# Patient Record
Sex: Male | Born: 1975
Health system: Southern US, Community
[De-identification: ages and names within clinical notes are randomized; demographics above are authoritative.]

## PROBLEM LIST (undated history)

## (undated) DIAGNOSIS — S52502A Unspecified fracture of the lower end of left radius, initial encounter for closed fracture: Secondary | ICD-10-CM

## (undated) HISTORY — PX: TONSILLECTOMY AND ADENOIDECTOMY: SHX28

---

## 2007-02-21 ENCOUNTER — Emergency Department (HOSPITAL_COMMUNITY): Admission: EM | Admit: 2007-02-21 | Discharge: 2007-02-21 | Payer: Self-pay | Admitting: Emergency Medicine

## 2015-05-23 ENCOUNTER — Encounter (HOSPITAL_COMMUNITY): Payer: Self-pay | Admitting: Emergency Medicine

## 2015-05-23 ENCOUNTER — Emergency Department (HOSPITAL_COMMUNITY)
Admission: EM | Admit: 2015-05-23 | Discharge: 2015-05-23 | Disposition: A | Discharge status: Home / Self Care | Payer: 59 | Attending: Emergency Medicine | Admitting: Emergency Medicine

## 2015-05-23 DIAGNOSIS — J029 Acute pharyngitis, unspecified: Secondary | ICD-10-CM | POA: Diagnosis not present

## 2015-05-23 DIAGNOSIS — R05 Cough: Secondary | ICD-10-CM | POA: Diagnosis not present

## 2015-05-23 DIAGNOSIS — Z79899 Other long term (current) drug therapy: Secondary | ICD-10-CM | POA: Diagnosis not present

## 2015-05-23 DIAGNOSIS — J019 Acute sinusitis, unspecified: Secondary | ICD-10-CM | POA: Diagnosis not present

## 2015-05-23 DIAGNOSIS — Z720 Tobacco use: Secondary | ICD-10-CM | POA: Insufficient documentation

## 2015-05-23 DIAGNOSIS — R0981 Nasal congestion: Secondary | ICD-10-CM | POA: Diagnosis present

## 2015-05-23 DIAGNOSIS — R059 Cough, unspecified: Secondary | ICD-10-CM

## 2015-05-23 MED ORDER — SALINE SPRAY 0.65 % NA SOLN: 1.0000 | NASAL | Status: DC | PRN

## 2015-05-23 MED ORDER — FLUTICASONE PROPIONATE 50 MCG/ACT NA SUSP: 2.0000 | Freq: Every day | NASAL | Status: DC

## 2015-05-23 MED ORDER — BENZONATATE 100 MG PO CAPS: 100.0000 mg | ORAL_CAPSULE | Freq: Three times a day (TID) | ORAL | Status: DC

## 2015-05-23 MED ORDER — PREDNISONE 20 MG PO TABS: 40.0000 mg | ORAL_TABLET | Freq: Every day | ORAL | Status: DC

## 2015-05-23 MED ORDER — ALBUTEROL SULFATE HFA 108 (90 BASE) MCG/ACT IN AERS
2.0000 | INHALATION_SPRAY | Freq: Once | RESPIRATORY_TRACT | Status: AC
  Administered 2015-05-23: 2 via RESPIRATORY_TRACT
  Filled 2015-05-23: qty 6.7

## 2015-05-23 NOTE — Discharge Instructions (Signed)
Take prednisone as prescribed and using albuterol inhaler, 2 puffs every 4-6 hours, as needed for cough and shortness of breath. Take Tessalon as needed for cough. Use saline nasal spray for congestion. You may also use Flonase as prescribed if desired for congestion. Continue with over-the-counter regimens as needed. Follow-up with a primary care provider for a recheck of symptoms.  Sinusitis Sinusitis is redness, soreness, and inflammation of the paranasal sinuses. Paranasal sinuses are air pockets within the bones of your face (beneath the eyes, the middle of the forehead, or above the eyes). In healthy paranasal sinuses, mucus is able to drain out, and air is able to circulate through them by way of your nose. However, when your paranasal sinuses are inflamed, mucus and air can become trapped. This can allow bacteria and other germs to grow and cause infection. Sinusitis can develop quickly and last only a short time (acute) or continue over a long period (chronic). Sinusitis that lasts for more than 12 weeks is considered chronic.  CAUSES  Causes of sinusitis include:  Allergies.  Structural abnormalities, such as displacement of the cartilage that separates your nostrils (deviated septum), which can decrease the air flow through your nose and sinuses and affect sinus drainage.  Functional abnormalities, such as when the small hairs (cilia) that line your sinuses and help remove mucus do not work properly or are not present. SIGNS AND SYMPTOMS  Symptoms of acute and chronic sinusitis are the same. The primary symptoms are pain and pressure around the affected sinuses. Other symptoms include:  Upper toothache.  Earache.  Headache.  Bad breath.  Decreased sense of smell and taste.  A cough, which worsens when you are lying flat.  Fatigue.  Fever.  Thick drainage from your nose, which often is green and may contain pus (purulent).  Swelling and warmth over the affected  sinuses. DIAGNOSIS  Your health care provider will perform a physical exam. During the exam, your health care provider may:  Look in your nose for signs of abnormal growths in your nostrils (nasal polyps).  Tap over the affected sinus to check for signs of infection.  View the inside of your sinuses (endoscopy) using an imaging device that has a light attached (endoscope). If your health care provider suspects that you have chronic sinusitis, one or more of the following tests may be recommended:  Allergy tests.  Nasal culture. A sample of mucus is taken from your nose, sent to a lab, and screened for bacteria.  Nasal cytology. A sample of mucus is taken from your nose and examined by your health care provider to determine if your sinusitis is related to an allergy. TREATMENT  Most cases of acute sinusitis are related to a viral infection and will resolve on their own within 10 days. Sometimes medicines are prescribed to help relieve symptoms (pain medicine, decongestants, nasal steroid sprays, or saline sprays).  However, for sinusitis related to a bacterial infection, your health care provider will prescribe antibiotic medicines. These are medicines that will help kill the bacteria causing the infection.  Rarely, sinusitis is caused by a fungal infection. In theses cases, your health care provider will prescribe antifungal medicine. For some cases of chronic sinusitis, surgery is needed. Generally, these are cases in which sinusitis recurs more than 3 times per year, despite other treatments. HOME CARE INSTRUCTIONS   Drink plenty of water. Water helps thin the mucus so your sinuses can drain more easily.  Use a humidifier.  Inhale steam 3  to 4 times a day (for example, sit in the bathroom with the shower running).  Apply a warm, moist washcloth to your face 3 to 4 times a day, or as directed by your health care provider.  Use saline nasal sprays to help moisten and clean your  sinuses.  Take medicines only as directed by your health care provider.  If you were prescribed either an antibiotic or antifungal medicine, finish it all even if you start to feel better. SEEK IMMEDIATE MEDICAL CARE IF:  You have increasing pain or severe headaches.  You have nausea, vomiting, or drowsiness.  You have swelling around your face.  You have vision problems.  You have a stiff neck.  You have difficulty breathing. MAKE SURE YOU:   Understand these instructions.  Will watch your condition.  Will get help right away if you are not doing well or get worse. Document Released: 08/17/2005 Document Revised: 01/01/2014 Document Reviewed: 09/01/2011 [facility] Patient Information 2015 [place], [place]. This information is not intended to replace advice given to you by your health care provider. Make sure you discuss any questions you have with your health care provider.

## 2015-05-23 NOTE — ED Notes (Signed)
Pt c/o cough, sneezing, congestion x 2 days, denies n/v/d.

## 2015-05-23 NOTE — ED Provider Notes (Signed)
CSN: [id number]     Arrival date & time 05/23/15  2314 History  This chart was scribed for non-physician practitioner, Antony Madura, PA-C working with Gerhard Munch, MD by Evon Slack, ED Scribe. This patient was seen in room WTR8/WTR8 and the patient's care was started at 11:24 PM.    Chief Complaint  Patient presents with  . Cough  . Nasal Congestion  . sneezing    The history is provided by the patient. No language interpreter was used.   HPI Comments: Timothy Levine is a 39 y.o. male who presents to the Emergency Department complaining of cough onset 2 days prior. Pt states that his symptoms began as a "scratchy" throat that has progressively worsened over the last 2 days. Pt states that he has had associated sneezing, congestion, rhinorrhea. Pt states that he has CP brought on from coughing. Pt reports taking OTC medication such as Mucinex with no relief. Pt does report being around his girlfriend who has similar symptoms. Pt denies fever, postnasal drip, n/v/d, fatigue or other related symptoms.   History reviewed. No pertinent past medical history. History reviewed. No pertinent past surgical history. No family history on file. Social History  Substance Use Topics  . Smoking status: Current Every Day Smoker  . Smokeless tobacco: None  . Alcohol Use: Yes    Review of Systems  Constitutional: Negative for fever and fatigue.  HENT: Positive for congestion, rhinorrhea and sore throat. Negative for postnasal drip and trouble swallowing.   Respiratory: Positive for cough. Negative for shortness of breath.   Gastrointestinal: Negative for nausea, vomiting and diarrhea.  All other systems reviewed and are negative.   Allergies  Review of patient's allergies indicates no known allergies.  Home Medications   Prior to Admission medications   Medication Sig Start Date End Date Taking? Authorizing Provider  guaiFENesin (MUCINEX) 600 MG 12 hr tablet Take 600 mg by mouth 2 (two) times  daily.   Yes Historical Provider, MD  guaifenesin (ROBITUSSIN) 100 MG/5ML syrup Take 200 mg by mouth 3 (three) times daily as needed for cough.   Yes Historical Provider, MD  Multiple Vitamin (MULTIVITAMIN WITH MINERALS) TABS tablet Take 1 tablet by mouth daily.   Yes Historical Provider, MD  benzonatate (TESSALON) 100 MG capsule Take 1 capsule (100 mg total) by mouth every 8 (eight) hours. 05/23/15   Antony Madura, PA-C  fluticasone (FLONASE) 50 MCG/ACT nasal spray Place 2 sprays into both nostrils daily. 05/23/15   Antony Madura, PA-C  predniSONE (DELTASONE) 20 MG tablet Take 2 tablets (40 mg total) by mouth daily. 05/23/15   Antony Madura, PA-C  sodium chloride (OCEAN) 0.65 % SOLN nasal spray Place 1 spray into both nostrils as needed for congestion. 05/23/15   Antony Madura, PA-C   BP 131/85 mmHg  Pulse 116  Temp(Src) 98.4 F (36.9 C) (Oral)  Resp 22  SpO2 100%   Physical Exam  Constitutional: He is oriented to person, place, and time. He appears well-developed and well-nourished. No distress.  Nontoxic/nonseptic appearing  HENT:  Head: Normocephalic and atraumatic.  Right Ear: Tympanic membrane, external ear and ear canal normal.  Left Ear: Tympanic membrane, external ear and ear canal normal.  Nose: Mucosal edema present. No rhinorrhea. Right sinus exhibits maxillary sinus tenderness and frontal sinus tenderness. Left sinus exhibits frontal sinus tenderness (mild). Left sinus exhibits no maxillary sinus tenderness.  Mouth/Throat: Uvula is midline, oropharynx is clear and moist and mucous membranes are normal.  Uvula midline. Oropharynx clear. No  palatal petechiae or exudates. Patient tolerating secretions without difficulty or drooling.  Eyes: Conjunctivae and EOM are normal. No scleral icterus.  Neck: Normal range of motion.  Cardiovascular: Regular rhythm and intact distal pulses.   Pulmonary/Chest: Effort normal and breath sounds normal. No respiratory distress. He has no wheezes. He has no  rales.  Respirations even and unlabored. Lungs clear. No accessory muscle use.  Musculoskeletal: Normal range of motion.  Neurological: He is alert and oriented to person, place, and time.  Skin: Skin is warm and dry. No rash noted. He is not diaphoretic. No erythema. No pallor.  Psychiatric: He has a normal mood and affect. His behavior is normal.  Nursing note and vitals reviewed.   ED Course  Procedures (including critical care time) DIAGNOSTIC STUDIES: Oxygen Saturation is 100% on RA, normal by my interpretation.    COORDINATION OF CARE: 11:36 PM-Discussed treatment plan with pt at bedside and pt agreed to plan.   Labs Review Labs Reviewed - No data to display  Imaging Review No results found.    EKG Interpretation None      MDM   Final diagnoses:  Acute sinusitis, recurrence not specified, unspecified location  Cough    Patient complaining of symptoms of sinusitis. Mild to moderate symptoms of clear/yellow nasal discharge/congestion and scratchy throat with cough for less than 10 days. Patient is afebrile. No concern for acute bacterial rhinosinusitis; likely viral in nature. Patient discharged with symptomatic treatment  Patient instructions given for warm saline nasal washes. Recommendations for follow-up with primary care physician. Return precautions discussed and provided. Patient agreeable to plan with no unaddressed concerns. Patient discharged in good condition.  I personally performed the services described in this documentation, which was scribed in my presence. The recorded information has been reviewed and is accurate.   Filed Vitals:   05/23/15 2316  BP: 131/85  Pulse: 116  Temp: 98.4 F (36.9 C)  TempSrc: Oral  Resp: 22  SpO2: 100%        Antony Madura, PA-C 05/23/15 2352  Gerhard Munch, MD 05/24/15 [phone]

## 2016-01-11 ENCOUNTER — Ambulatory Visit (HOSPITAL_COMMUNITY)
Admission: EM | Admit: 2016-01-11 | Discharge: 2016-01-11 | Disposition: A | Discharge status: Home / Self Care | Payer: 59 | Attending: Emergency Medicine | Admitting: Emergency Medicine

## 2016-01-11 ENCOUNTER — Encounter (HOSPITAL_COMMUNITY): Payer: Self-pay | Admitting: Emergency Medicine

## 2016-01-11 DIAGNOSIS — K047 Periapical abscess without sinus: Secondary | ICD-10-CM | POA: Diagnosis not present

## 2016-01-11 MED ORDER — CLINDAMYCIN HCL 300 MG PO CAPS: 300.0000 mg | ORAL_CAPSULE | Freq: Four times a day (QID) | ORAL | Status: DC

## 2016-01-11 MED ORDER — HYDROCODONE-ACETAMINOPHEN 10-325 MG PO TABS: 1.0000 | ORAL_TABLET | Freq: Four times a day (QID) | ORAL | Status: DC | PRN

## 2016-01-11 NOTE — ED Notes (Signed)
D/c by frank patrick, pa 

## 2016-01-11 NOTE — Discharge Instructions (Signed)
Dental Abscess A dental abscess is pus in or around a tooth. HOME CARE  Take medicines only as told by your dentist.  If you were prescribed antibiotic medicine, finish all of it even if you start to feel better.  Rinse your mouth (gargle) often with salt water.  Do not drive or use heavy machinery, like a lawn mower, while taking pain medicine.  Do not apply heat to the outside of your mouth.  Keep all follow-up visits as told by your dentist. This is important. GET HELP IF:  Your pain is worse, and medicine does not help. GET HELP RIGHT AWAY IF:  You have a fever or chills.  Your symptoms suddenly get worse.  You have a very bad headache.  You have problems breathing or swallowing.  You have trouble opening your mouth.  You have puffiness (swelling) in your neck or around your eye.   This information is not intended to replace advice given to you by your health care provider. Make sure you discuss any questions you have with your health care provider.   Document Released: 01/01/2015 Document Reviewed: 01/01/2015 Elsevier Interactive Patient Education 2016 [employer]  

## 2016-01-11 NOTE — ED Notes (Signed)
C/o right upper side dental pain onset 4-5 months... Does not have a dentist at the moment Taking Ibup w/temp relief.  A&O x4... No acute distress.

## 2016-01-11 NOTE — ED Provider Notes (Signed)
CSN: [id number]     Arrival date & time 01/11/16  1827 History   First MD Initiated Contact with Patient 01/11/16 1852     Chief Complaint  Patient presents with  . Dental Pain   (Consider location/radiation/quality/duration/timing/severity/associated sxs/prior Treatment) HPI History obtained from patient:  Pt presents with the cc of: Dental pain and infection Duration of symptoms: Off and on over 4 months worse in the last week Treatment prior to arrival: Over-the-counter medications without significant relief Context: States he thinks he is developing an abscess in his mouth at this time. States that he has poor teeth that is not been treated by a dentist as he does not have dental insurance. Other symptoms include: Foul odor to his breath Pain score: 5 FAMILY HISTORY: Hypertension mother SOCIAL HISTORY: Smoker  History reviewed. No pertinent past medical history. History reviewed. No pertinent past surgical history. No family history on file. Social History  Substance Use Topics  . Smoking status: Current Every Day Smoker  . Smokeless tobacco: None  . Alcohol Use: Yes    Review of Systems ROS +'ve dental pain and infection  Denies: HEADACHE, NAUSEA, ABDOMINAL PAIN, CHEST PAIN, CONGESTION, DYSURIA, SHORTNESS OF BREATH  Allergies  Review of patient's allergies indicates no known allergies.  Home Medications   Prior to Admission medications   Medication Sig Start Date End Date Taking? Authorizing Provider  benzonatate (TESSALON) 100 MG capsule Take 1 capsule (100 mg total) by mouth every 8 (eight) hours. 05/23/15   Antony Madura, PA-C  clindamycin (CLEOCIN) 300 MG capsule Take 1 capsule (300 mg total) by mouth every 6 (six) hours. 01/11/16   Tharon Aquas, PA  fluticasone (FLONASE) 50 MCG/ACT nasal spray Place 2 sprays into both nostrils daily. 05/23/15   Antony Madura, PA-C  guaiFENesin (MUCINEX) 600 MG 12 hr tablet Take 600 mg by mouth 2 (two) times daily.    Historical  Provider, MD  guaifenesin (ROBITUSSIN) 100 MG/5ML syrup Take 200 mg by mouth 3 (three) times daily as needed for cough.    Historical Provider, MD  HYDROcodone-acetaminophen (NORCO) 10-325 MG tablet Take 1 tablet by mouth every 6 (six) hours as needed. 01/11/16   Tharon Aquas, PA  Multiple Vitamin (MULTIVITAMIN WITH MINERALS) TABS tablet Take 1 tablet by mouth daily.    Historical Provider, MD  predniSONE (DELTASONE) 20 MG tablet Take 2 tablets (40 mg total) by mouth daily. 05/23/15   Antony Madura, PA-C  sodium chloride (OCEAN) 0.65 % SOLN nasal spray Place 1 spray into both nostrils as needed for congestion. 05/23/15   Antony Madura, PA-C   Meds Ordered and Administered this Visit  Medications - No data to display  BP 149/101 mmHg  Pulse 90  Temp(Src) 98.4 F (36.9 C) (Oral)  Resp 16  SpO2 100% No data found.   Physical Exam NURSES NOTES AND VITAL SIGNS REVIEWED. CONSTITUTIONAL: Well developed, well nourished, no acute distress HEENT: normocephalic, atraumatic: Mouth: There is a small abscess noted on the hard palate off of tooth #12. It is tender to palpation. Fluctuant. No signs of cellulitis. Teeth are in poor repair. EYES: Conjunctiva normal NECK:normal ROM, supple, no adenopathy PULMONARY:No respiratory distress, normal effort ABDOMINAL: Soft, ND, NT BS+, No CVAT MUSCULOSKELETAL: Normal ROM of all extremities,  SKIN: warm and dry without rash PSYCHIATRIC: Mood and affect, behavior are normal  ED Course  Procedures (including critical care time)  Labs Review Labs Reviewed - No data to display  Imaging Review No results found.  Visual Acuity Review  Right Eye Distance:   Left Eye Distance:   Bilateral Distance:    Right Eye Near:   Left Eye Near:    Bilateral Near:      Prescriptions for clindamycin and hydrocodone  Discussion with patient on treatment plan which includes aspirating the abscess patient states that he would decline aspiration procedure at this  time. He is afraid of needles. MDM   1. Dental abscess     Patient is reassured that there are no issues that require transfer to higher level of care at this time or additional tests. Patient is advised to continue home symptomatic treatment. Patient is advised that if there are new or worsening symptoms to attend the emergency department, contact primary care provider, or return to UC. Instructions of care provided discharged home in stable condition.    THIS NOTE WAS GENERATED USING A VOICE RECOGNITION SOFTWARE PROGRAM. ALL REASONABLE EFFORTS  WERE MADE TO PROOFREAD THIS DOCUMENT FOR ACCURACY.  I have verbally reviewed the discharge instructions with the patient. A printed AVS was given to the patient.  All questions were answered prior to discharge.      Tharon AquasFrank C Seena Face, GeorgiaPA 01/11/16 [phone]

## 2016-02-08 DIAGNOSIS — S52502A Unspecified fracture of the lower end of left radius, initial encounter for closed fracture: Secondary | ICD-10-CM

## 2016-02-08 HISTORY — DX: Unspecified fracture of the lower end of left radius, initial encounter for closed fracture: S52.502A

## 2016-02-09 ENCOUNTER — Emergency Department (HOSPITAL_COMMUNITY): Payer: 59

## 2016-02-09 ENCOUNTER — Encounter (HOSPITAL_COMMUNITY): Payer: Self-pay

## 2016-02-09 ENCOUNTER — Emergency Department (HOSPITAL_COMMUNITY)
Admission: EM | Admit: 2016-02-09 | Discharge: 2016-02-09 | Disposition: A | Discharge status: Home / Self Care | Payer: 59 | Attending: Emergency Medicine | Admitting: Emergency Medicine

## 2016-02-09 DIAGNOSIS — Y999 Unspecified external cause status: Secondary | ICD-10-CM | POA: Insufficient documentation

## 2016-02-09 DIAGNOSIS — S52612A Displaced fracture of left ulna styloid process, initial encounter for closed fracture: Secondary | ICD-10-CM

## 2016-02-09 DIAGNOSIS — Y939 Activity, unspecified: Secondary | ICD-10-CM | POA: Insufficient documentation

## 2016-02-09 DIAGNOSIS — F17210 Nicotine dependence, cigarettes, uncomplicated: Secondary | ICD-10-CM | POA: Insufficient documentation

## 2016-02-09 DIAGNOSIS — S6992XA Unspecified injury of left wrist, hand and finger(s), initial encounter: Secondary | ICD-10-CM | POA: Diagnosis present

## 2016-02-09 DIAGNOSIS — Y929 Unspecified place or not applicable: Secondary | ICD-10-CM | POA: Diagnosis not present

## 2016-02-09 DIAGNOSIS — W010XXA Fall on same level from slipping, tripping and stumbling without subsequent striking against object, initial encounter: Secondary | ICD-10-CM | POA: Insufficient documentation

## 2016-02-09 DIAGNOSIS — Z79899 Other long term (current) drug therapy: Secondary | ICD-10-CM | POA: Diagnosis not present

## 2016-02-09 DIAGNOSIS — S52502A Unspecified fracture of the lower end of left radius, initial encounter for closed fracture: Secondary | ICD-10-CM | POA: Diagnosis not present

## 2016-02-09 MED ORDER — OXYCODONE-ACETAMINOPHEN 5-325 MG PO TABS
1.0000 | ORAL_TABLET | Freq: Once | ORAL | Status: AC
  Administered 2016-02-09: 1 via ORAL
  Filled 2016-02-09: qty 1

## 2016-02-09 MED ORDER — ONDANSETRON 4 MG PO TBDP
4.0000 mg | ORAL_TABLET | Freq: Once | ORAL | Status: AC
  Administered 2016-02-09: 4 mg via ORAL
  Filled 2016-02-09: qty 1

## 2016-02-09 MED ORDER — LIDOCAINE HCL 2 % IJ SOLN
10.0000 mL | Freq: Once | INTRAMUSCULAR | Status: AC
  Administered 2016-02-09: 10 mL via INTRADERMAL
  Filled 2016-02-09: qty 20

## 2016-02-09 MED ORDER — HYDROCODONE-ACETAMINOPHEN 10-325 MG PO TABS: 1.0000 | ORAL_TABLET | Freq: Four times a day (QID) | ORAL | Status: DC | PRN

## 2016-02-09 NOTE — ED Notes (Signed)
Per EMS patient with ETOH on board, dove over couch and landed on left wrist.  Per EMS left wrist with obvious deformity.  VS en route BP 90/60 PR 88, RR 16 and rates pain 5/10

## 2016-02-09 NOTE — ED Provider Notes (Signed)
CSN: [id number]     Arrival date & time 02/09/16  0435 History   First MD Initiated Contact with Patient 02/09/16 [phone]     Chief Complaint  Patient presents with  . Wrist Pain    HPI   Timothy Levine is an 40 y.o. male with no significant PMH who presents to the ED for evaluation of a left wrist injury that occurred while intoxicated overnight. He states he was drinking with some friends and either tripped or fell over a couch and landed on his left outstretched hand. In the ED now his left wrist is in a temporary brace placed by EMS. He states the pain is minimal at rest, 9/10 when he tries to move. He states he has pain all over his wrist. Denies numbness, weakness, tingling. He has not taken anything for his pain. He states he drank about 7-8 beers last night. He states he last ate around 5 PM last evening.  History reviewed. No pertinent past medical history. History reviewed. No pertinent past surgical history. No family history on file. Social History  Substance Use Topics  . Smoking status: Current Every Day Smoker -- 0.50 packs/day    Types: Cigarettes  . Smokeless tobacco: None  . Alcohol Use: Yes     Comment: Socially    Review of Systems  All other systems reviewed and are negative.     Allergies  Review of patient's allergies indicates no known allergies.  Home Medications   Prior to Admission medications   Medication Sig Start Date End Date Taking? Authorizing Provider  benzonatate (TESSALON) 100 MG capsule Take 1 capsule (100 mg total) by mouth every 8 (eight) hours. 05/23/15   Antony Madura, PA-C  clindamycin (CLEOCIN) 300 MG capsule Take 1 capsule (300 mg total) by mouth every 6 (six) hours. 01/11/16   Tharon Aquas, PA  fluticasone (FLONASE) 50 MCG/ACT nasal spray Place 2 sprays into both nostrils daily. 05/23/15   Antony Madura, PA-C  guaiFENesin (MUCINEX) 600 MG 12 hr tablet Take 600 mg by mouth 2 (two) times daily.    Historical Provider, MD  guaifenesin (ROBITUSSIN)  100 MG/5ML syrup Take 200 mg by mouth 3 (three) times daily as needed for cough.    Historical Provider, MD  HYDROcodone-acetaminophen (NORCO) 10-325 MG tablet Take 1 tablet by mouth every 6 (six) hours as needed. 01/11/16   Tharon Aquas, PA  Multiple Vitamin (MULTIVITAMIN WITH MINERALS) TABS tablet Take 1 tablet by mouth daily.    Historical Provider, MD  predniSONE (DELTASONE) 20 MG tablet Take 2 tablets (40 mg total) by mouth daily. 05/23/15   Antony Madura, PA-C  sodium chloride (OCEAN) 0.65 % SOLN nasal spray Place 1 spray into both nostrils as needed for congestion. 05/23/15   Antony Madura, PA-C   BP 111/74 mmHg  Pulse 84  Temp(Src) 98.1 F (36.7 C) (Oral)  Resp 18  Ht  (1.88 m)  Wt 83.915 kg  BMI 23.74 kg/m2  SpO2 95% Physical Exam  Constitutional: He is oriented to person, place, and time. No distress.  HENT:  Head: Atraumatic.  Right Ear: External ear normal.  Left Ear: External ear normal.  Nose: Nose normal.  Eyes: Conjunctivae are normal. No scleral icterus.  Neck: Normal range of motion. Neck supple.  Cardiovascular: Normal rate and regular rhythm.   Pulmonary/Chest: Effort normal. No respiratory distress. He exhibits no tenderness.  Abdominal: Soft. He exhibits no distension. There is no tenderness.  Musculoskeletal:  Left wrist with  dorsal angulation. Diffusely swollen and tender. 2+ radial pulse. Can flex and extend fingers but states is painful. Limited wrist ROM. Brisk cap refill.   Neurological: He is alert and oriented to person, place, and time.  Skin: Skin is warm and dry. He is not diaphoretic.  Psychiatric: He has a normal mood and affect. His behavior is normal.  Mildly intoxicated  Nursing note and vitals reviewed.   ED Course  Procedures (including critical care time) Labs Review Labs Reviewed - No data to display  Imaging Review Dg Wrist Complete Left  02/09/2016  CLINICAL DATA:  Post reduction.  Wrist fracture. EXAM: LEFT WRIST - COMPLETE 3+  VIEW COMPARISON:  February 09, 2016 FINDINGS: The distal radius fracture has been reduced in the interval with significant improvement in alignment. An ulnar styloid fracture is again identified. No other interval changes. IMPRESSION: Reduction of the distal forearm fractures. Electronically Signed   By: Gerome Samavid  Williams III M.D   On: 02/09/2016 07:20   Dg Wrist Complete Left  02/09/2016  CLINICAL DATA:  Status post fall, with left wrist pain. Initial encounter. EXAM: LEFT WRIST - COMPLETE 3+ VIEW COMPARISON:  None. FINDINGS: There is a comminuted and mildly impacted fracture of the distal radial metaphysis, with dorsal displacement and angulation. A mildly displaced ulnar styloid fracture is also noted. Soft tissue swelling is noted about the wrist. The carpal rows appear grossly intact, and demonstrate normal alignment, articulating with the distal radial fragments. IMPRESSION: Comminuted and mildly impacted fracture of the distal radial metaphysis, with dorsal displacement and angulation. Mildly displaced ulnar styloid fracture also noted. Electronically Signed   By: Roanna RaiderJeffery  Chang M.D.   On: 02/09/2016 05:38   I have personally reviewed and evaluated these images and lab results as part of my medical decision-making.   EKG Interpretation None      MDM   Final diagnoses:  Distal radius fracture, left, closed, initial encounter  Fracture of ulnar styloid, left, closed, initial encounter    X-ray reveals a closed, comminuted and mildly impacted radial metaphysis fracture with associated mildly displaced ulnar styloid fracture. Given comminution and impaction will call hand for consult.  I spoke with Dr. Janee Mornhompson who will review the x-rays and call back. Appreciate assistance.  Dr. Janee Mornhompson to come evaluate pt in the ED.   Dr. Janee Mornhompson is here at bedside to attempt reduction in the ED.  Successful reduction in the ED. Pt placed in splint and shoulder sling. D/c rx and instructions provided by  Dr. Janee Mornhompson. Pt is stable and nontoxic appearing, ready for discharge. Instructed outpatient f/u. ER return precautions given.  Carlene CoriaSerena Y Erdem Naas, PA-C 02/09/16 [phone]  Tomasita CrumbleAdeleke Oni, MD 02/09/16 [phone]

## 2016-02-09 NOTE — Consult Note (Signed)
ORTHOPAEDIC CONSULTATION HISTORY & PHYSICAL REQUESTING PHYSICIAN: Tomasita Crumble, MD  Chief Complaint: Left wrist deformity  HPI: Timothy Levine is a 40 y.o. male who reports tripping and falling onto an outstretched left hand earlier, 2-3 hours ago.  Had the immediate onset of pain and deformity of the left distal forearm.  Denies pain elsewhere.  History reviewed. No pertinent past medical history. History reviewed. No pertinent past surgical history. Social History   Social History  . Marital Status: Single    Spouse Name: N/A  . Number of Children: N/A  . Years of Education: N/A   Social History Main Topics  . Smoking status: Current Every Day Smoker -- 0.50 packs/day    Types: Cigarettes  . Smokeless tobacco: None  . Alcohol Use: Yes     Comment: Socially  . Drug Use: No  . Sexual Activity: Not Asked   Other Topics Concern  . None   Social History Narrative   No family history on file. No Known Allergies Prior to Admission medications   Medication Sig Start Date End Date Taking? Authorizing Provider  benzonatate (TESSALON) 100 MG capsule Take 1 capsule (100 mg total) by mouth every 8 (eight) hours. 05/23/15   Antony Madura, PA-C  clindamycin (CLEOCIN) 300 MG capsule Take 1 capsule (300 mg total) by mouth every 6 (six) hours. 01/11/16   Tharon Aquas, PA  fluticasone (FLONASE) 50 MCG/ACT nasal spray Place 2 sprays into both nostrils daily. 05/23/15   Antony Madura, PA-C  guaiFENesin (MUCINEX) 600 MG 12 hr tablet Take 600 mg by mouth 2 (two) times daily.    Historical Provider, MD  guaifenesin (ROBITUSSIN) 100 MG/5ML syrup Take 200 mg by mouth 3 (three) times daily as needed for cough.    Historical Provider, MD  HYDROcodone-acetaminophen (NORCO) 10-325 MG tablet Take 1 tablet by mouth every 6 (six) hours as needed. 01/11/16   Tharon Aquas, PA  Multiple Vitamin (MULTIVITAMIN WITH MINERALS) TABS tablet Take 1 tablet by mouth daily.    Historical Provider, MD  predniSONE  (DELTASONE) 20 MG tablet Take 2 tablets (40 mg total) by mouth daily. 05/23/15   Antony Madura, PA-C  sodium chloride (OCEAN) 0.65 % SOLN nasal spray Place 1 spray into both nostrils as needed for congestion. 05/23/15   Antony Madura, PA-C   Dg Wrist Complete Left  02/09/2016  CLINICAL DATA:  Status post fall, with left wrist pain. Initial encounter. EXAM: LEFT WRIST - COMPLETE 3+ VIEW COMPARISON:  None. FINDINGS: There is a comminuted and mildly impacted fracture of the distal radial metaphysis, with dorsal displacement and angulation. A mildly displaced ulnar styloid fracture is also noted. Soft tissue swelling is noted about the wrist. The carpal rows appear grossly intact, and demonstrate normal alignment, articulating with the distal radial fragments. IMPRESSION: Comminuted and mildly impacted fracture of the distal radial metaphysis, with dorsal displacement and angulation. Mildly displaced ulnar styloid fracture also noted. Electronically Signed   By: Roanna Raider M.D.   On: 02/09/2016 05:38    Positive ROS: All other systems have been reviewed and were otherwise negative with the exception of those mentioned in the HPI and as above.  Physical Exam: Vitals: Refer to EMR. Constitutional:  WD, WN, NAD HEENT:  NCAT, EOMI Neuro/Psych:  Alert & oriented to person, place, and time; appropriate mood & affect Lymphatic: No generalized extremity edema or lymphadenopathy Extremities / MSK:  The extremities are normal with respect to appearance, ranges of motion, joint stability, muscle strength/tone, sensation, &  perfusion except as otherwise noted:  Left distal forearm obviously dorsally translated/angulated.  Intact light touch sensibility in the radial, median, ulnar nerve distributions.  Intact motor to same.  Fingers warm with brisk capillary refill, radial pulse palpable.  No tenderness about the elbow or proximally.  Assessment: Dorsally displaced and angulated distal radius fracture  Plan: I  discussed these findings with the patient.  Recommended closed reduction emergency department initially, possibly with definitive open management later if closed reduction fails to obtain acceptable alignment for long-term acceptance.  Hematoma block instilled by me.  1% plain lidocaine used.  After allowing time for the block to set up, gentleman reduction was performed and sugar tong splint applied.  Postreduction plain films revealed improved alignment.  No deterioration in post reduction neurovascular exam.  Will discharge with analgesics and plan for outpatient follow-up, at which time he should have new x-rays of the left wrist to include an inclined lateral in the splint.  WORK STATUS: NO LEFT HANDED WORK FOR AT LEAST 2 WEEKS  Anuradha Chabot A. Janee Mornhompson, MD      [facility] [address] [place], [place]  [id number] Office: [phone] Mobile: [phone]  02/09/2016, 6:53 AM

## 2016-02-09 NOTE — ED Notes (Signed)
Dr. Janee Mornhompson at bedside for reduction

## 2016-02-09 NOTE — Discharge Instructions (Addendum)
Discharge Instructions   Move your fingers as much as possible, making a full fist and fully opening the fist.   WITH YOU LEFT HAND, NO LIFTING, GRIPPING, OR GRASPING MORE THAN PAPER, PENCIL, KNIFE/FORK, ETC. Elevate your hand to reduce pain & swelling of the digits.  Ice over the operative site may be helpful to reduce pain & swelling.  DO NOT USE HEAT. Pain medicine has been prescribed for you.  Use your medicine as needed over the first 48 hours, and then you can begin to taper your use.  You may use Tylenol in place of your prescribed pain medication, but not IN ADDITION to it. Leave the splint in place until you return to our office, keeping it clean and dry. You may shower, but keep the bandage clean & dry.  You may drive a car when you are off of prescription pain medications and can safely control your vehicle with both hands. Our office will call you to arrange follow-up   Please call [phone] during normal business hours or [phone] after hours for any problems. Including the following:  - excessive redness of the incisions - drainage for more than 4 days - fever of more than 101.5 F  *Please note that pain medications will not be refilled after hours or on weekends.  WORK STATUS:  NO WORK WITH LEFT HAND FOR AT LEAST 2 WEEKS

## 2016-02-11 ENCOUNTER — Other Ambulatory Visit: Payer: Self-pay | Admitting: Orthopedic Surgery

## 2016-02-11 ENCOUNTER — Encounter (HOSPITAL_BASED_OUTPATIENT_CLINIC_OR_DEPARTMENT_OTHER): Payer: Self-pay | Admitting: *Deleted

## 2016-02-13 ENCOUNTER — Encounter (HOSPITAL_BASED_OUTPATIENT_CLINIC_OR_DEPARTMENT_OTHER)
Admission: RE | Disposition: A | Discharge status: Home / Self Care | Payer: Self-pay | Source: Ambulatory Visit | Attending: Orthopedic Surgery

## 2016-02-13 ENCOUNTER — Ambulatory Visit (HOSPITAL_BASED_OUTPATIENT_CLINIC_OR_DEPARTMENT_OTHER)
Admission: RE | Admit: 2016-02-13 | Discharge: 2016-02-13 | Disposition: A | Discharge status: Home / Self Care | Payer: 59 | Source: Ambulatory Visit | Attending: Orthopedic Surgery | Admitting: Orthopedic Surgery

## 2016-02-13 ENCOUNTER — Encounter (HOSPITAL_BASED_OUTPATIENT_CLINIC_OR_DEPARTMENT_OTHER): Payer: Self-pay | Admitting: Anesthesiology

## 2016-02-13 ENCOUNTER — Ambulatory Visit (HOSPITAL_BASED_OUTPATIENT_CLINIC_OR_DEPARTMENT_OTHER): Payer: 59 | Admitting: Anesthesiology

## 2016-02-13 ENCOUNTER — Ambulatory Visit (HOSPITAL_COMMUNITY): Payer: 59

## 2016-02-13 DIAGNOSIS — S52502A Unspecified fracture of the lower end of left radius, initial encounter for closed fracture: Secondary | ICD-10-CM | POA: Insufficient documentation

## 2016-02-13 DIAGNOSIS — Z419 Encounter for procedure for purposes other than remedying health state, unspecified: Secondary | ICD-10-CM

## 2016-02-13 DIAGNOSIS — F17210 Nicotine dependence, cigarettes, uncomplicated: Secondary | ICD-10-CM | POA: Diagnosis not present

## 2016-02-13 DIAGNOSIS — X58XXXA Exposure to other specified factors, initial encounter: Secondary | ICD-10-CM | POA: Insufficient documentation

## 2016-02-13 HISTORY — PX: OPEN REDUCTION INTERNAL FIXATION (ORIF) DISTAL RADIAL FRACTURE: SHX5989

## 2016-02-13 HISTORY — DX: Unspecified fracture of the lower end of left radius, initial encounter for closed fracture: S52.502A

## 2016-02-13 SURGERY — OPEN REDUCTION INTERNAL FIXATION (ORIF) DISTAL RADIUS FRACTURE
Anesthesia: Regional | Site: Wrist | Laterality: Left

## 2016-02-13 MED ORDER — SUFENTANIL CITRATE 50 MCG/ML IV SOLN
INTRAVENOUS | Status: AC
  Filled 2016-02-13: qty 1

## 2016-02-13 MED ORDER — LACTATED RINGERS IV SOLN: INTRAVENOUS | Status: DC

## 2016-02-13 MED ORDER — SCOPOLAMINE 1 MG/3DAYS TD PT72: 1.0000 | MEDICATED_PATCH | Freq: Once | TRANSDERMAL | Status: DC | PRN

## 2016-02-13 MED ORDER — BUPIVACAINE-EPINEPHRINE (PF) 0.5% -1:200000 IJ SOLN
INTRAMUSCULAR | Status: DC | PRN
  Administered 2016-02-13: 20 mL via PERINEURAL

## 2016-02-13 MED ORDER — DEXAMETHASONE SODIUM PHOSPHATE 10 MG/ML IJ SOLN
INTRAMUSCULAR | Status: DC | PRN
Start: 2016-02-13 — End: 2016-02-13
  Administered 2016-02-13: 10 mg via INTRAVENOUS

## 2016-02-13 MED ORDER — GLYCOPYRROLATE 0.2 MG/ML IJ SOLN: 0.2000 mg | Freq: Once | INTRAMUSCULAR | Status: DC | PRN

## 2016-02-13 MED ORDER — 0.9 % SODIUM CHLORIDE (POUR BTL) OPTIME
TOPICAL | Status: DC | PRN
  Administered 2016-02-13: 200 mL

## 2016-02-13 MED ORDER — HYDROCODONE-ACETAMINOPHEN 10-325 MG PO TABS: 1.0000 | ORAL_TABLET | Freq: Four times a day (QID) | ORAL | Status: DC | PRN

## 2016-02-13 MED ORDER — FENTANYL CITRATE (PF) 100 MCG/2ML IJ SOLN: 25.0000 ug | INTRAMUSCULAR | Status: DC | PRN

## 2016-02-13 MED ORDER — FENTANYL CITRATE (PF) 100 MCG/2ML IJ SOLN
INTRAMUSCULAR | Status: AC
  Filled 2016-02-13: qty 2

## 2016-02-13 MED ORDER — LACTATED RINGERS IV SOLN
INTRAVENOUS | Status: DC
  Administered 2016-02-13 (×2): via INTRAVENOUS

## 2016-02-13 MED ORDER — PROMETHAZINE HCL 25 MG/ML IJ SOLN: 6.2500 mg | INTRAMUSCULAR | Status: DC | PRN

## 2016-02-13 MED ORDER — MIDAZOLAM HCL 2 MG/2ML IJ SOLN
1.0000 mg | INTRAMUSCULAR | Status: DC | PRN
  Administered 2016-02-13 (×2): 2 mg via INTRAVENOUS

## 2016-02-13 MED ORDER — CEFAZOLIN SODIUM-DEXTROSE 2-4 GM/100ML-% IV SOLN
2.0000 g | INTRAVENOUS | Status: AC
  Administered 2016-02-13: 2 g via INTRAVENOUS

## 2016-02-13 MED ORDER — SUFENTANIL CITRATE 50 MCG/ML IV SOLN
INTRAVENOUS | Status: DC | PRN
  Administered 2016-02-13: 5 ug via INTRAVENOUS

## 2016-02-13 MED ORDER — LIDOCAINE HCL (CARDIAC) 20 MG/ML IV SOLN
INTRAVENOUS | Status: DC | PRN
Start: 2016-02-13 — End: 2016-02-13
  Administered 2016-02-13: 50 mg via INTRAVENOUS

## 2016-02-13 MED ORDER — MIDAZOLAM HCL 2 MG/2ML IJ SOLN
INTRAMUSCULAR | Status: AC
  Filled 2016-02-13: qty 2

## 2016-02-13 MED ORDER — LIDOCAINE HCL 2 % IJ SOLN
INTRAMUSCULAR | Status: AC
  Filled 2016-02-13: qty 20

## 2016-02-13 MED ORDER — ONDANSETRON HCL 4 MG/2ML IJ SOLN
INTRAMUSCULAR | Status: DC | PRN
  Administered 2016-02-13: 4 mg via INTRAVENOUS

## 2016-02-13 MED ORDER — CEFAZOLIN SODIUM-DEXTROSE 2-4 GM/100ML-% IV SOLN
INTRAVENOUS | Status: AC
  Filled 2016-02-13: qty 100

## 2016-02-13 MED ORDER — PROPOFOL 10 MG/ML IV BOLUS
INTRAVENOUS | Status: DC | PRN
Start: 2016-02-13 — End: 2016-02-13
  Administered 2016-02-13: 200 mg via INTRAVENOUS

## 2016-02-13 MED ORDER — FENTANYL CITRATE (PF) 100 MCG/2ML IJ SOLN
50.0000 ug | INTRAMUSCULAR | Status: DC | PRN
  Administered 2016-02-13: 100 ug via INTRAVENOUS

## 2016-02-13 SURGICAL SUPPLY — 70 items
BANDAGE COBAN STERILE 2 (GAUZE/BANDAGES/DRESSINGS) IMPLANT
BIT DRILL 2 FAST STEP (BIT) ×2 IMPLANT
BIT DRILL 2.5X4 QC (BIT) ×2 IMPLANT
BLADE SURG 15 STRL LF DISP TIS (BLADE) ×1 IMPLANT
BLADE SURG 15 STRL SS (BLADE) ×3
BNDG CMPR 9X4 STRL LF SNTH (GAUZE/BANDAGES/DRESSINGS) ×1
BNDG COHESIVE 4X5 TAN STRL (GAUZE/BANDAGES/DRESSINGS) ×3 IMPLANT
BNDG ESMARK 4X9 LF (GAUZE/BANDAGES/DRESSINGS) ×3 IMPLANT
BNDG GAUZE ELAST 4 BULKY (GAUZE/BANDAGES/DRESSINGS) ×4 IMPLANT
BRUSH SCRUB EZ PLAIN DRY (MISCELLANEOUS) ×2 IMPLANT
CANISTER SUCT 1200ML W/VALVE (MISCELLANEOUS) ×3 IMPLANT
CHLORAPREP W/TINT 26ML (MISCELLANEOUS) ×3 IMPLANT
CORDS BIPOLAR (ELECTRODE) ×3 IMPLANT
COVER BACK TABLE 60X90IN (DRAPES) ×3 IMPLANT
COVER MAYO STAND STRL (DRAPES) ×3 IMPLANT
CUFF TOURNIQUET SINGLE 18IN (TOURNIQUET CUFF) ×2 IMPLANT
DRAPE C-ARM 42X72 X-RAY (DRAPES) ×3 IMPLANT
DRAPE EXTREMITY T 121X128X90 (DRAPE) ×3 IMPLANT
DRAPE SURG 17X23 STRL (DRAPES) ×3 IMPLANT
DRIVER PEG 2.0 FAST (Orthopedic Implant) ×4 IMPLANT
DRSG ADAPTIC 3X8 NADH LF (GAUZE/BANDAGES/DRESSINGS) ×3 IMPLANT
DRSG EMULSION OIL 3X3 NADH (GAUZE/BANDAGES/DRESSINGS) IMPLANT
ELECT REM PT RETURN 9FT ADLT (ELECTROSURGICAL) ×3
ELECTRODE REM PT RTRN 9FT ADLT (ELECTROSURGICAL) ×1 IMPLANT
GAUZE SPONGE 4X4 12PLY STRL (GAUZE/BANDAGES/DRESSINGS) ×3 IMPLANT
GLOVE BIO SURGEON STRL SZ7.5 (GLOVE) ×3 IMPLANT
GLOVE BIOGEL M STRL SZ7.5 (GLOVE) ×2 IMPLANT
GLOVE BIOGEL PI IND STRL 7.0 (GLOVE) ×1 IMPLANT
GLOVE BIOGEL PI IND STRL 8 (GLOVE) ×1 IMPLANT
GLOVE BIOGEL PI INDICATOR 7.0 (GLOVE) ×2
GLOVE BIOGEL PI INDICATOR 8 (GLOVE) ×4
GLOVE ECLIPSE 6.5 STRL STRAW (GLOVE) ×3 IMPLANT
GOWN STRL REUS W/ TWL LRG LVL3 (GOWN DISPOSABLE) ×2 IMPLANT
GOWN STRL REUS W/TWL LRG LVL3 (GOWN DISPOSABLE) ×3
GOWN STRL REUS W/TWL XL LVL3 (GOWN DISPOSABLE) ×5 IMPLANT
NDL HYPO 25X1 1.5 SAFETY (NEEDLE) IMPLANT
NEEDLE HYPO 25X1 1.5 SAFETY (NEEDLE) IMPLANT
NS IRRIG 1000ML POUR BTL (IV SOLUTION) ×3 IMPLANT
PACK BASIN DAY SURGERY FS (CUSTOM PROCEDURE TRAY) ×3 IMPLANT
PADDING CAST ABS 4INX4YD NS (CAST SUPPLIES)
PADDING CAST ABS COTTON 4X4 ST (CAST SUPPLIES) IMPLANT
PEG SUBCHONDRAL SMOOTH 2.0X18 (Peg) ×2 IMPLANT
PEG SUBCHONDRAL SMOOTH 2.0X20 (Peg) ×2 IMPLANT
PEG SUBCHONDRAL SMOOTH 2.0X22 (Peg) ×2 IMPLANT
PEG SUBCHONDRAL SMOOTH 2.0X24 (Peg) ×8 IMPLANT
PEG THREADED 2.5MMX24MM LONG (Peg) ×2 IMPLANT
PENCIL BUTTON HOLSTER BLD 10FT (ELECTRODE) ×3 IMPLANT
PLATE SHORT 24.4X51.3 LT (Plate) ×2 IMPLANT
RUBBERBAND STERILE (MISCELLANEOUS) IMPLANT
SCREW BN 12X3.5XNS CORT TI (Screw) IMPLANT
SCREW CORT 3.5X12 (Screw) ×3 IMPLANT
SCREW CORT 3.5X14 LNG (Screw) ×4 IMPLANT
SLEEVE SCD COMPRESS KNEE MED (MISCELLANEOUS) ×3 IMPLANT
SPLINT PLASTER CAST XFAST 3X15 (CAST SUPPLIES) IMPLANT
SPLINT PLASTER XTRA FASTSET 3X (CAST SUPPLIES)
STOCKINETTE 6  STRL (DRAPES) ×2
STOCKINETTE 6 STRL (DRAPES) ×1 IMPLANT
SUCTION FRAZIER HANDLE 10FR (MISCELLANEOUS) ×2
SUCTION TUBE FRAZIER 10FR DISP (MISCELLANEOUS) ×1 IMPLANT
SUT VIC AB 2-0 PS2 27 (SUTURE) ×3 IMPLANT
SUT VICRYL 4-0 PS2 18IN ABS (SUTURE) IMPLANT
SUT VICRYL RAPIDE 4-0 (SUTURE) IMPLANT
SUT VICRYL RAPIDE 4/0 PS 2 (SUTURE) ×3 IMPLANT
SYR BULB 3OZ (MISCELLANEOUS) ×3 IMPLANT
SYRINGE 10CC LL (SYRINGE) IMPLANT
TOWEL OR 17X24 6PK STRL BLUE (TOWEL DISPOSABLE) ×3 IMPLANT
TOWEL OR NON WOVEN STRL DISP B (DISPOSABLE) ×3 IMPLANT
TUBE CONNECTING 20'X1/4 (TUBING) ×1
TUBE CONNECTING 20X1/4 (TUBING) ×2 IMPLANT
UNDERPAD 30X30 (UNDERPADS AND DIAPERS) ×3 IMPLANT

## 2016-02-13 NOTE — Op Note (Signed)
02/13/2016  2:50 PM  PATIENT:  Timothy Levine  40 y.o. male  PRE-OPERATIVE DIAGNOSIS:  Displaced left distal radius fracture--intra-articular with 2+ fragments  POST-OPERATIVE DIAGNOSIS:  Same  PROCEDURE:  ORIF left distal radius fracture  SURGEON: Cliffton Astersavid A. Janee Mornhompson, MD  PHYSICIAN ASSISTANT: Danielle RankinKirsten Schrader, OPA-C  ANESTHESIA:  regional and general  SPECIMENS:  None  DRAINS: None  EBL:  less than 50 mL  PREOPERATIVE INDICATIONS:  Timothy Levine is a  40 y.o. male with a displaced left distal radius fracture, having undergone provisional reduction in the ED.  The risks benefits and alternatives were discussed with the patient preoperatively including but not limited to the risks of infection, bleeding, nerve injury, cardiopulmonary complications, the need for revision surgery, among others, and the patient verbalized understanding and consented to proceed.  OPERATIVE IMPLANTS: Biomet DVR plate/screws  OPERATIVE PROCEDURE: After receiving prophylactic antibiotics and a regional block, the patient was escorted to the operative theatre and placed in a supine position. General anesthesia was administered.   A surgical "time-out" was performed during which the planned procedure, proposed operative site, and the correct patient identity were compared to the operative consent and agreement confirmed by the circulating nurse according to current facility policy. Following application of a tourniquet to the operative extremity, the exposed skin was pre-scrubbed with a Hibiclens scrub brush and then was prepped with Chloraprep and draped in the usual sterile fashion. The limb was exsanguinated with an Esmarch bandage and the tourniquet inflated to approximately 100mmHg higher than systolic BP.   A sinusoidal-shaped incision was marked and made over the FCR axis and the distal forearm. The skin was incised sharply with scalpel, subcutaneous tissues with blunt and spreading dissection. The FCR axis was  exploited deeply. The pronator quadratus was reflected in an L-shaped ulnarly and the brachioradialis was split in a Z-plasty fashion for later reapproximation. The fracture was inspected and provisionally reduced.  This was confirmed fluoroscopically. The appropriately sized plate was selected and found to fit well. It was placed in its provisional alignment of the radius and this was confirmed fluoroscopically.  It was secured to the radius with a screw through the slotted hole.  Additional adjustments were made as necessary, and the distal holes were all drilled and filled.  Peg/screw length distally was selected on the shorter side of measurements to minimize the risk for dorsal cortical penetration. The remainder of the proximal holes were drilled and filled.   Final images were obtained and the DRUJ was examined for stability. It was found to be sufficiently stable. The wound was then copiously irrigated and the brachioradialis repaired with 2-0 Vicryl Rapide suture followed by repair of the pronator quadratus with the same suture type. Tourniquet was released and additional hemostasis obtained and the skin was closed with 2-0 Vicryl deep dermal buried sutures followed by running 4-0 Vicryl Rapide horizontal mattress suture in the skin. A bulky dressing with a volar plaster component was applied and she was taken to room stable condition.  DISPOSITION: The patient will be discharged home today with typical post-op instructions, returning in 10-15 days for reevaluation with new x-rays of the affected wrist out of the splint to include an inclined lateral and then transition to therapy to have a custom splint constructed and begin rehabilitation.

## 2016-02-13 NOTE — H&P (View-Only) (Signed)
ORTHOPAEDIC CONSULTATION HISTORY & PHYSICAL REQUESTING PHYSICIAN: Adeleke Oni, MD  Chief Complaint: Left wrist deformity  HPI: Timothy Levine is a 40 y.o. male who reports tripping and falling onto an outstretched left hand earlier, 2-3 hours ago.  Had the immediate onset of pain and deformity of the left distal forearm.  Denies pain elsewhere.  History reviewed. No pertinent past medical history. History reviewed. No pertinent past surgical history. Social History   Social History  . Marital Status: Single    Spouse Name: N/A  . Number of Children: N/A  . Years of Education: N/A   Social History Main Topics  . Smoking status: Current Every Day Smoker -- 0.50 packs/day    Types: Cigarettes  . Smokeless tobacco: None  . Alcohol Use: Yes     Comment: Socially  . Drug Use: No  . Sexual Activity: Not Asked   Other Topics Concern  . None   Social History Narrative   No family history on file. No Known Allergies Prior to Admission medications   Medication Sig Start Date End Date Taking? Authorizing Provider  benzonatate (TESSALON) 100 MG capsule Take 1 capsule (100 mg total) by mouth every 8 (eight) hours. 05/23/15   Kelly Humes, PA-C  clindamycin (CLEOCIN) 300 MG capsule Take 1 capsule (300 mg total) by mouth every 6 (six) hours. 01/11/16   Frank C Patrick, PA  fluticasone (FLONASE) 50 MCG/ACT nasal spray Place 2 sprays into both nostrils daily. 05/23/15   Kelly Humes, PA-C  guaiFENesin (MUCINEX) 600 MG 12 hr tablet Take 600 mg by mouth 2 (two) times daily.    Historical Provider, MD  guaifenesin (ROBITUSSIN) 100 MG/5ML syrup Take 200 mg by mouth 3 (three) times daily as needed for cough.    Historical Provider, MD  HYDROcodone-acetaminophen (NORCO) 10-325 MG tablet Take 1 tablet by mouth every 6 (six) hours as needed. 01/11/16   Frank C Patrick, PA  Multiple Vitamin (MULTIVITAMIN WITH MINERALS) TABS tablet Take 1 tablet by mouth daily.    Historical Provider, MD  predniSONE  (DELTASONE) 20 MG tablet Take 2 tablets (40 mg total) by mouth daily. 05/23/15   Kelly Humes, PA-C  sodium chloride (OCEAN) 0.65 % SOLN nasal spray Place 1 spray into both nostrils as needed for congestion. 05/23/15   Kelly Humes, PA-C   Dg Wrist Complete Left  02/09/2016  CLINICAL DATA:  Status post fall, with left wrist pain. Initial encounter. EXAM: LEFT WRIST - COMPLETE 3+ VIEW COMPARISON:  None. FINDINGS: There is a comminuted and mildly impacted fracture of the distal radial metaphysis, with dorsal displacement and angulation. A mildly displaced ulnar styloid fracture is also noted. Soft tissue swelling is noted about the wrist. The carpal rows appear grossly intact, and demonstrate normal alignment, articulating with the distal radial fragments. IMPRESSION: Comminuted and mildly impacted fracture of the distal radial metaphysis, with dorsal displacement and angulation. Mildly displaced ulnar styloid fracture also noted. Electronically Signed   By: Jeffery  Chang M.D.   On: 02/09/2016 05:38    Positive ROS: All other systems have been reviewed and were otherwise negative with the exception of those mentioned in the HPI and as above.  Physical Exam: Vitals: Refer to EMR. Constitutional:  WD, WN, NAD HEENT:  NCAT, EOMI Neuro/Psych:  Alert & oriented to person, place, and time; appropriate mood & affect Lymphatic: No generalized extremity edema or lymphadenopathy Extremities / MSK:  The extremities are normal with respect to appearance, ranges of motion, joint stability, muscle strength/tone, sensation, &   perfusion except as otherwise noted:  Left distal forearm obviously dorsally translated/angulated.  Intact light touch sensibility in the radial, median, ulnar nerve distributions.  Intact motor to same.  Fingers warm with brisk capillary refill, radial pulse palpable.  No tenderness about the elbow or proximally.  Assessment: Dorsally displaced and angulated distal radius fracture  Plan: I  discussed these findings with the patient.  Recommended closed reduction emergency department initially, possibly with definitive open management later if closed reduction fails to obtain acceptable alignment for long-term acceptance.  Hematoma block instilled by me.  1% plain lidocaine used.  After allowing time for the block to set up, gentleman reduction was performed and sugar tong splint applied.  Postreduction plain films revealed improved alignment.  No deterioration in post reduction neurovascular exam.  Will discharge with analgesics and plan for outpatient follow-up, at which time he should have new x-rays of the left wrist to include an inclined lateral in the splint.  WORK STATUS: NO LEFT HANDED WORK FOR AT LEAST 2 WEEKS  Amra Shukla A. Janee Mornhompson, MD      [facility] [address] [place], [place]  [id number] Office: [phone] Mobile: [phone]  02/09/2016, 6:53 AM

## 2016-02-13 NOTE — Discharge Instructions (Signed)
Discharge Instructions ° ° °You have a dressing with a plaster splint incorporated in it. °Move your fingers as much as possible, making a full fist and fully opening the fist. °Elevate your hand to reduce pain & swelling of the digits.  Ice over the operative site may be helpful to reduce pain & swelling.  DO NOT USE HEAT. °Pain medicine has been prescribed for you.  °Use your medicine as needed over the first 48 hours, and then you can begin to taper your use.  You may use Tylenol in place of your prescribed pain medication, but not IN ADDITION to it. °Leave the dressing in place until you return to our office.  °You may shower, but keep the bandage clean & dry.  °You may drive a car when you are off of prescription pain medications and can safely control your vehicle with both hands. °Our office will call you to arrange follow-up ° ° °Please call [phone] during normal business hours or [phone] after hours for any problems. Including the following: ° °- excessive redness of the incisions °- drainage for more than 4 days °- fever of more than 101.5 F ° °*Please note that pain medications will not be refilled after hours or on weekends. ° ° °Post Anesthesia Home Care Instructions ° °Activity: °Get plenty of rest for the remainder of the day. A responsible adult should stay with you for 24 hours following the procedure.  °For the next 24 hours, DO NOT: °-Drive a car °-Operate machinery °-Drink alcoholic beverages °-Take any medication unless instructed by your physician °-Make any legal decisions or sign important papers. ° °Meals: °Start with liquid foods such as gelatin or soup. Progress to regular foods as tolerated. Avoid greasy, spicy, heavy foods. If nausea and/or vomiting occur, drink only clear liquids until the nausea and/or vomiting subsides. Call your physician if vomiting continues. ° °Special Instructions/Symptoms: °Your throat may feel dry or sore from the anesthesia or the breathing tube  placed in your throat during surgery. If this causes discomfort, gargle with warm salt water. The discomfort should disappear within 24 hours. ° °If you had a scopolamine patch placed behind your ear for the management of post- operative nausea and/or vomiting: ° °1. The medication in the patch is effective for 72 hours, after which it should be removed.  Wrap patch in a tissue and discard in the trash. Wash hands thoroughly with soap and water. °2. You may remove the patch earlier than 72 hours if you experience unpleasant side effects which may include dry mouth, dizziness or visual disturbances. °3. Avoid touching the patch. Wash your hands with soap and water after contact with the patch. °  °Regional Anesthesia Blocks ° °1. Numbness or the inability to move the "blocked" extremity may last from 3-48 hours after placement. The length of time depends on the medication injected and your individual response to the medication. If the numbness is not going away after 48 hours, call your surgeon. ° °2. The extremity that is blocked will need to be protected until the numbness is gone and the  Strength has returned. Because you cannot feel it, you will need to take extra care to avoid injury. Because it may be weak, you may have difficulty moving it or using it. You may not know what position it is in without looking at it while the block is in effect. ° °3. For blocks in the legs and feet, returning to weight bearing and walking needs to be   done carefully. You will need to wait until the numbness is entirely gone and the strength has returned. You should be able to move your leg and foot normally before you try and bear weight or walk. You will need someone to be with you when you first try to ensure you do not fall and possibly risk injury. ° °4. Bruising and tenderness at the needle site are common side effects and will resolve in a few days. ° °5. Persistent numbness or new problems with movement should be  communicated to the surgeon or the [facility] ([phone])/ [facility] ([phone]). ° °

## 2016-02-13 NOTE — Progress Notes (Signed)
Assisted Dr. Turk with left, ultrasound guided, interscalene  block. Side rails up, monitors on throughout procedure. See vital signs in flow sheet. Tolerated Procedure well. 

## 2016-02-13 NOTE — Anesthesia Procedure Notes (Addendum)
Anesthesia Regional Block:  Supraclavicular block  Pre-Anesthetic Checklist: ,, timeout performed, Correct Patient, Correct Site, Correct Laterality, Correct Procedure, Correct Position, site marked, Risks and benefits discussed,  Surgical consent,  Pre-op evaluation,  At surgeon's request and post-op pain management  Laterality: Left  Prep: chloraprep       Needles:  Injection technique: Single-shot  Needle Type: Echogenic Stimulator Needle     Needle Length: 9cm 9 cm Needle Gauge: 22 and 22 G    Additional Needles:  Procedures: ultrasound guided (picture in chart) Supraclavicular block Narrative:  Injection made incrementally with aspirations every 5 mL.  Performed by: Personally  Anesthesiologist: Cecile HearingURK, STEPHEN EDWARD  Additional Notes: Functioning IV was confirmed and monitors were applied.  A 90mm 22ga Arrow echogenic stimulator needle was used. Sterile prep and drape, hand hygiene, and sterile gloves were used.  Negative aspiration and negative test dose prior to incremental administration of local anesthetic. The patient tolerated the procedure well.  Ultrasound guidance: relevent anatomy identified, needle position confirmed, local anesthetic spread visualized around nerve(s), vascular puncture avoided.  Image printed for medical record.    Procedure Name: LMA Insertion Date/Time: 02/13/2016 3:03 PM Performed by: Zenia ResidesPAYNE, Nusaybah Ivie D Pre-anesthesia Checklist: Patient identified, Emergency Drugs available, Suction available and Patient being monitored Patient Re-evaluated:Patient Re-evaluated prior to inductionOxygen Delivery Method: Circle system utilized Preoxygenation: Pre-oxygenation with 100% oxygen Intubation Type: IV induction Ventilation: Mask ventilation without difficulty LMA: LMA inserted LMA Size: 5.0 Number of attempts: 1 Airway Equipment and Method: Bite block Placement Confirmation: positive ETCO2 Tube secured with: Tape Dental Injury: Teeth and  Oropharynx as per pre-operative assessment

## 2016-02-13 NOTE — Anesthesia Preprocedure Evaluation (Addendum)
Anesthesia Evaluation  Patient identified by MRN, date of birth, ID band Patient awake    Reviewed: Allergy & Precautions, NPO status , Patient's Chart, lab work & pertinent test results  Airway Mallampati: II  TM Distance: >3 FB Neck ROM: Full    Dental  (+) Teeth Intact, Dental Advisory Given   Pulmonary Current Smoker,    Pulmonary exam normal breath sounds clear to auscultation       Cardiovascular Exercise Tolerance: Good negative cardio ROS Normal cardiovascular exam Rhythm:Regular Rate:Normal     Neuro/Psych negative neurological ROS  negative psych ROS   GI/Hepatic negative GI ROS, Neg liver ROS,   Endo/Other  negative endocrine ROS  Renal/GU negative Renal ROS     Musculoskeletal negative musculoskeletal ROS (+) Left distal radius fracture   Abdominal   Peds  Hematology negative hematology ROS (+)   Anesthesia Other Findings Day of surgery medications reviewed with the patient.  Reproductive/Obstetrics                            Anesthesia Physical Anesthesia Plan  ASA: II  Anesthesia Plan: Regional and General   Post-op Pain Management:  Regional for Post-op pain   Induction: Intravenous  Airway Management Planned: Nasal Cannula  Additional Equipment:   Intra-op Plan:   Post-operative Plan:   Informed Consent: I have reviewed the patients History and Physical, chart, labs and discussed the procedure including the risks, benefits and alternatives for the proposed anesthesia with the patient or authorized representative who has indicated his/her understanding and acceptance.   Dental advisory given  Plan Discussed with:   Anesthesia Plan Comments: (Risks/benefits of regional block discussed with patient including risk of bleeding, infection, nerve damage, and possibility of failed block.  Also discussed backup plan of general anesthesia and associated risks.  Patient  wishes to proceed.)       Anesthesia Quick Evaluation

## 2016-02-13 NOTE — Interval H&P Note (Signed)
History and Physical Interval Note:  02/13/2016 2:19 PM  Timothy Levine  has presented today for surgery, with the diagnosis of LEFT DISTAL RADIUS FRACTURE S52.572A  The various methods of treatment have been discussed with the patient and family. After consideration of risks, benefits and other options for treatment, the patient has consented to  Procedure(s): OPEN TREATMENT OF LEFT DISTAL RADIUS FRACTURE (Left) as a surgical intervention .  The patient's history has been reviewed, patient examined, no change in status, stable for surgery.  I have reviewed the patient's chart and labs.  Questions were answered to the patient's satisfaction.     Ashton Belote A.

## 2016-02-13 NOTE — Transfer of Care (Signed)
Immediate Anesthesia Transfer of Care Note  Patient: Earley Abidedam Brandvold  Procedure(s) Performed: Procedure(s): OPEN TREATMENT OF LEFT DISTAL RADIUS FRACTURE (Left)  Patient Location: PACU  Anesthesia Type:GA combined with regional for post-op pain  Level of Consciousness: sedated  Airway & Oxygen Therapy: Patient Spontanous Breathing and Patient connected to face mask oxygen  Post-op Assessment: Report given to RN and Post -op Vital signs reviewed and stable  Post vital signs: Reviewed and stable  Last Vitals:  Filed Vitals:   02/13/16 1334 02/13/16 1613  BP: 113/73 134/83  Pulse: 75 92  Temp: 36.7 C   Resp: 18     Last Pain:  Filed Vitals:   02/13/16 1618  PainSc: 5       Patients Stated Pain Goal: 2 (02/13/16 1334)  Complications: No apparent anesthesia complications

## 2016-02-13 NOTE — Anesthesia Postprocedure Evaluation (Signed)
Anesthesia Post Note  Patient: Timothy Levine  Procedure(s) Performed: Procedure(s) (LRB): OPEN TREATMENT OF LEFT DISTAL RADIUS FRACTURE (Left)  Patient location during evaluation: PACU Anesthesia Type: General and Regional Level of consciousness: awake and alert Pain management: pain level controlled Vital Signs Assessment: post-procedure vital signs reviewed and stable Respiratory status: spontaneous breathing, nonlabored ventilation, respiratory function stable and patient connected to nasal cannula oxygen Cardiovascular status: blood pressure returned to baseline and stable Postop Assessment: no signs of nausea or vomiting Anesthetic complications: no    Last Vitals:  Filed Vitals:   02/13/16 1700 02/13/16 1730  BP: 130/99 134/95  Pulse: 84 75  Temp:  36.4 C  Resp: 14 16    Last Pain:  Filed Vitals:   02/13/16 1741  PainSc: 0-No pain                 Cecile HearingStephen Edward Jakhiya Brower

## 2016-02-14 ENCOUNTER — Encounter (HOSPITAL_BASED_OUTPATIENT_CLINIC_OR_DEPARTMENT_OTHER): Payer: Self-pay | Admitting: Orthopedic Surgery

## 2016-06-18 ENCOUNTER — Encounter (HOSPITAL_COMMUNITY): Payer: Self-pay | Admitting: Family Medicine

## 2016-06-18 ENCOUNTER — Ambulatory Visit (HOSPITAL_COMMUNITY)
Admission: EM | Admit: 2016-06-18 | Discharge: 2016-06-18 | Disposition: A | Discharge status: Home / Self Care | Payer: 59 | Attending: Family Medicine | Admitting: Family Medicine

## 2016-06-18 DIAGNOSIS — B9789 Other viral agents as the cause of diseases classified elsewhere: Secondary | ICD-10-CM

## 2016-06-18 DIAGNOSIS — J069 Acute upper respiratory infection, unspecified: Secondary | ICD-10-CM | POA: Diagnosis not present

## 2016-06-18 MED ORDER — HYDROCODONE-HOMATROPINE 5-1.5 MG/5ML PO SYRP: 5.0000 mL | ORAL_SOLUTION | Freq: Four times a day (QID) | ORAL | 0 refills | Status: DC | PRN

## 2016-06-18 MED ORDER — MELOXICAM 7.5 MG PO TABS: 7.5000 mg | ORAL_TABLET | Freq: Every day | ORAL | 0 refills | Status: DC

## 2016-06-18 NOTE — ED Provider Notes (Signed)
[facility]    CSN: [id number] Arrival date & time: 06/18/16  1210     History   Chief Complaint Chief Complaint  Patient presents with  . URI    HPI Timothy Levine is a 40 y.o. male.   This is a 40 yo man who presents with upper respiratory symptoms. He works at a [employer]. He smokes cigarettes and is married with one child.  Patient developed rhinorrhea yesterday and then today he started coughing and developing some myalgias. He's had no fever that he knows of.      Past Medical History:  Diagnosis Date  . Distal radius fracture, left 02/08/2016    There are no active problems to display for this patient.   Past Surgical History:  Procedure Laterality Date  . OPEN REDUCTION INTERNAL FIXATION (ORIF) DISTAL RADIAL FRACTURE Left 02/13/2016   Procedure: OPEN TREATMENT OF LEFT DISTAL RADIUS FRACTURE;  Surgeon: Mack Hookavid Thompson, MD;  Location: [facility];  Service: Orthopedics;  Laterality: Left;  . TONSILLECTOMY AND ADENOIDECTOMY         Home Medications    Prior to Admission medications   Medication Sig Start Date End Date Taking? Authorizing Provider  HYDROcodone-homatropine (HYCODAN) 5-1.5 MG/5ML syrup Take 5 mLs by mouth every 6 (six) hours as needed for cough. 06/18/16   Elvina SidleKurt Jennavie Martinek, MD  meloxicam (MOBIC) 7.5 MG tablet Take 1 tablet (7.5 mg total) by mouth daily. 06/18/16   Elvina SidleKurt Dickey Caamano, MD    Family History History reviewed. No pertinent family history.  Social History Social History  Substance Use Topics  . Smoking status: Current Every Day Smoker    Packs/day: 0.00    Years: 14.00    Types: Cigarettes  . Smokeless tobacco: Never Used     Comment: 1/3 pack/day  . Alcohol use Yes     Comment: occasionally     Allergies   Review of patient's allergies indicates no known allergies.   Review of Systems Review of Systems  Constitutional: Positive for chills, diaphoresis and fatigue. Negative for fever.    HENT: Positive for congestion and sneezing. Negative for drooling, sinus pressure and sore throat.   Eyes: Negative.   Respiratory: Positive for cough.   Cardiovascular: Negative.      Physical Exam Triage Vital Signs ED Triage Vitals [06/18/16 1300]  Enc Vitals Group     BP 126/86     Pulse Rate 91     Resp 14     Temp 98.2 F (36.8 C)     Temp Source Oral     SpO2 100 %     Weight      Height      Head Circumference      Peak Flow      Pain Score      Pain Loc      Pain Edu?      Excl. in GC?    No data found.   Updated Vital Signs BP 126/86 (BP Location: Right Arm)   Pulse 91   Temp 98.2 F (36.8 C) (Oral)   Resp 14   SpO2 100%      Physical Exam  Constitutional: He is oriented to person, place, and time. He appears well-developed and well-nourished.  HENT:  Head: Normocephalic.  Right Ear: External ear normal.  Left Ear: External ear normal.  Nose: Nose normal.  Mouth/Throat: Oropharynx is clear and moist.  Eyes: EOM are normal. Pupils are equal, round, and reactive  to light. Right eye exhibits no discharge. Left eye exhibits no discharge.  Neck: Normal range of motion. Neck supple.  Cardiovascular: Normal rate, regular rhythm and normal heart sounds.   Pulmonary/Chest: Effort normal and breath sounds normal.  Musculoskeletal: Normal range of motion.  Lymphadenopathy:    He has no cervical adenopathy.  Neurological: He is alert and oriented to person, place, and time.  Skin: Skin is warm and dry.  Nursing note and vitals reviewed.    UC Treatments / Results  Labs (all labs ordered are listed, but only abnormal results are displayed) Labs Reviewed - No data to display  EKG  EKG Interpretation None       Radiology No results found.  Procedures Procedures (including critical care time)  Medications Ordered in UC Medications - No data to display   Initial Impression / Assessment and Plan / UC Course  I have reviewed the triage vital  signs and the nursing notes.  Pertinent labs & imaging results that were available during my care of the patient were reviewed by me and considered in my medical decision making (see chart for details).  Clinical Course      Final Clinical Impressions(s) / UC Diagnoses   Final diagnoses:  Viral upper respiratory tract infection    New Prescriptions New Prescriptions   HYDROCODONE-HOMATROPINE (HYCODAN) 5-1.5 MG/5ML SYRUP    Take 5 mLs by mouth every 6 (six) hours as needed for cough.   MELOXICAM (MOBIC) 7.5 MG TABLET    Take 1 tablet (7.5 mg total) by mouth daily.     Elvina Sidle, MD 06/18/16 1312

## 2016-06-18 NOTE — ED Triage Notes (Signed)
Pt here for cough, cold symptoms.

## 2016-10-21 ENCOUNTER — Ambulatory Visit (HOSPITAL_COMMUNITY)
Admission: EM | Admit: 2016-10-21 | Discharge: 2016-10-21 | Disposition: A | Discharge status: Home / Self Care | Payer: 59 | Attending: Family Medicine | Admitting: Family Medicine

## 2016-10-21 ENCOUNTER — Encounter (HOSPITAL_COMMUNITY): Payer: Self-pay | Admitting: Emergency Medicine

## 2016-10-21 DIAGNOSIS — R6889 Other general symptoms and signs: Secondary | ICD-10-CM

## 2016-10-21 DIAGNOSIS — R059 Cough, unspecified: Secondary | ICD-10-CM

## 2016-10-21 DIAGNOSIS — J40 Bronchitis, not specified as acute or chronic: Secondary | ICD-10-CM | POA: Diagnosis not present

## 2016-10-21 DIAGNOSIS — R05 Cough: Secondary | ICD-10-CM

## 2016-10-21 MED ORDER — AZITHROMYCIN 250 MG PO TABS: 250.0000 mg | ORAL_TABLET | Freq: Every day | ORAL | 0 refills | Status: DC

## 2016-10-21 MED ORDER — BENZONATATE 100 MG PO CAPS: 100.0000 mg | ORAL_CAPSULE | Freq: Three times a day (TID) | ORAL | 0 refills | Status: DC

## 2016-10-21 MED ORDER — OSELTAMIVIR PHOSPHATE 75 MG PO CAPS
75.0000 mg | ORAL_CAPSULE | Freq: Two times a day (BID) | ORAL | 0 refills | Status: DC
Start: 2016-10-21 — End: 2018-03-30

## 2016-10-21 NOTE — ED Provider Notes (Signed)
CSN: [id number]     Arrival date & time 10/21/16  0957 History   First MD Initiated Contact with Patient 10/21/16 1019     Chief Complaint  Patient presents with  . URI   (Consider location/radiation/quality/duration/timing/severity/associated sxs/prior Treatment) Patient is c/o weakness, cough, congestion, chills, fever and congestion over last 2 days.  He is a smoker.   The history is provided by the patient.  URI  Presenting symptoms: congestion, cough, fatigue, fever, rhinorrhea and sore throat   Severity:  Moderate Onset quality:  Sudden Duration:  2 days Timing:  Constant Chronicity:  New Relieved by:  Nothing Worsened by:  Nothing Associated symptoms: arthralgias, myalgias and sneezing     Past Medical History:  Diagnosis Date  . Distal radius fracture, left 02/08/2016   Past Surgical History:  Procedure Laterality Date  . OPEN REDUCTION INTERNAL FIXATION (ORIF) DISTAL RADIAL FRACTURE Left 02/13/2016   Procedure: OPEN TREATMENT OF LEFT DISTAL RADIUS FRACTURE;  Surgeon: Mack Hook, MD;  Location: [facility];  Service: Orthopedics;  Laterality: Left;  . TONSILLECTOMY AND ADENOIDECTOMY     History reviewed. No pertinent family history. Social History  Substance Use Topics  . Smoking status: Current Every Day Smoker    Packs/day: 0.00    Years: 14.00    Types: Cigarettes  . Smokeless tobacco: Never Used     Comment: 1/3 pack/day  . Alcohol use Yes     Comment: occasionally    Review of Systems  Constitutional: Positive for fatigue and fever.  HENT: Positive for congestion, rhinorrhea, sneezing and sore throat.   Eyes: Negative.   Respiratory: Positive for cough.   Cardiovascular: Negative.   Gastrointestinal: Negative.   Endocrine: Negative.   Genitourinary: Negative.   Musculoskeletal: Positive for arthralgias and myalgias.  Allergic/Immunologic: Negative.   Hematological: Negative.   Psychiatric/Behavioral: Negative.     Allergies   Patient has no known allergies.  Home Medications   Prior to Admission medications   Medication Sig Start Date End Date Taking? Authorizing Provider  azithromycin (ZITHROMAX) 250 MG tablet Take 1 tablet (250 mg total) by mouth daily. Take first 2 tablets together, then 1 every day until finished. 10/21/16   Deatra Canter, FNP  benzonatate (TESSALON) 100 MG capsule Take 1 capsule (100 mg total) by mouth every 8 (eight) hours. 10/21/16   Deatra Canter, FNP  HYDROcodone-homatropine [facility]) 5-1.5 MG/5ML syrup Take 5 mLs by mouth every 6 (six) hours as needed for cough. 06/18/16   Elvina Sidle, MD  meloxicam (MOBIC) 7.5 MG tablet Take 1 tablet (7.5 mg total) by mouth daily. 06/18/16   Elvina Sidle, MD  oseltamivir (TAMIFLU) 75 MG capsule Take 1 capsule (75 mg total) by mouth every 12 (twelve) hours. 10/21/16   Deatra Canter, FNP   Meds Ordered and Administered this Visit  Medications - No data to display  BP 130/92 (BP Location: Right Arm)   Pulse 101   Temp 98 F (36.7 C) (Oral)   SpO2 95%  No data found.   Physical Exam  Constitutional: He is oriented to person, place, and time. He appears well-developed and well-nourished.  HENT:  Head: Normocephalic and atraumatic.  Right Ear: External ear normal.  Left Ear: External ear normal.  Mouth/Throat: Oropharynx is clear and moist.  Eyes: Conjunctivae and EOM are normal. Pupils are equal, round, and reactive to light.  Neck: Normal range of motion. Neck supple.  Cardiovascular: Normal rate, regular rhythm and normal heart sounds.  Pulmonary/Chest: Effort normal and breath sounds normal.  Neurological: He is alert and oriented to person, place, and time.  Nursing note and vitals reviewed.   Urgent Care Course     Procedures (including critical care time)  Labs Review Labs Reviewed - No data to display  Imaging Review No results found.   Visual Acuity Review  Right Eye Distance:   Left Eye Distance:    Bilateral Distance:    Right Eye Near:   Left Eye Near:    Bilateral Near:         MDM   1. Bronchitis   2. Cough   3. Flu-like symptoms    Tamiflu 75mg  one po bid x 5 days #10 Tessalon Perles 100mg  one po tid prn #21 Zpak as directed  Quit smoking  Push po fluids, rest, tylenol and motrin otc prn as directed for fever, arthralgias, and myalgias.  Follow up prn if sx's continue or persist.    Deatra CanterWilliam J Adrin Julian, FNP 10/21/16 1056

## 2017-05-13 IMAGING — CR DG WRIST COMPLETE 3+V*L*
4 series · 4 of 4 positions shown · non-contrast
Comparison: None.

CLINICAL DATA: Status post fall, with left wrist pain. Initial
encounter.

EXAM:
LEFT WRIST - COMPLETE 3+ VIEW

[x wrist pa left]
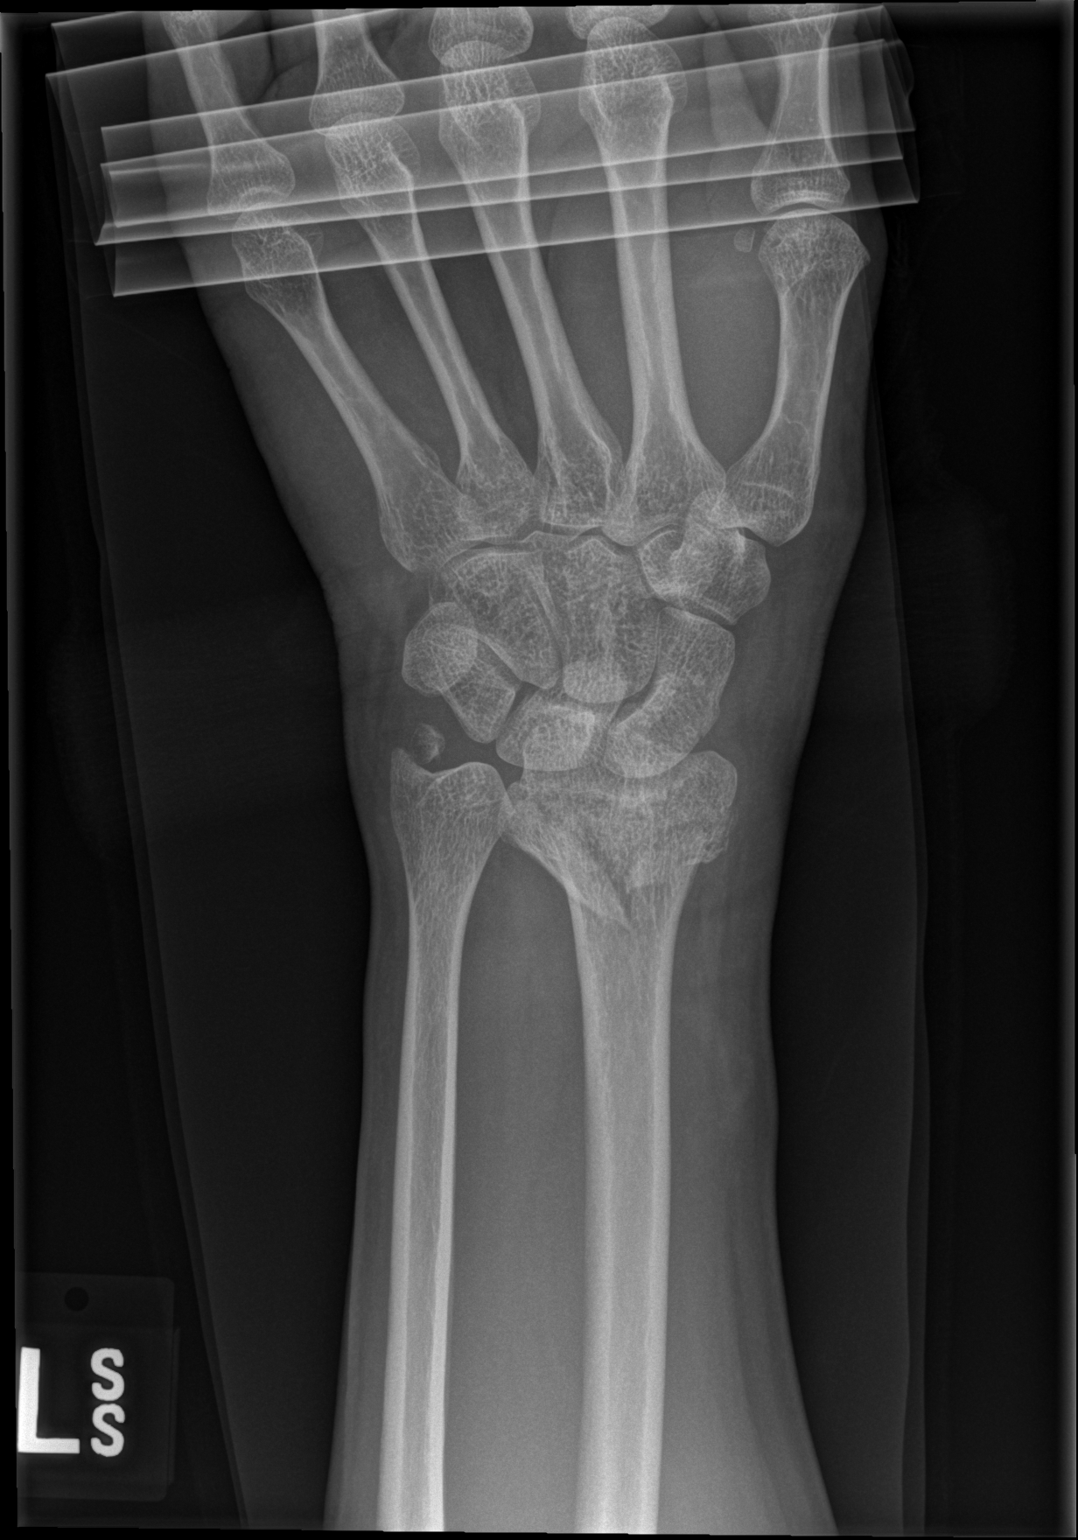

[x wrist obl left]
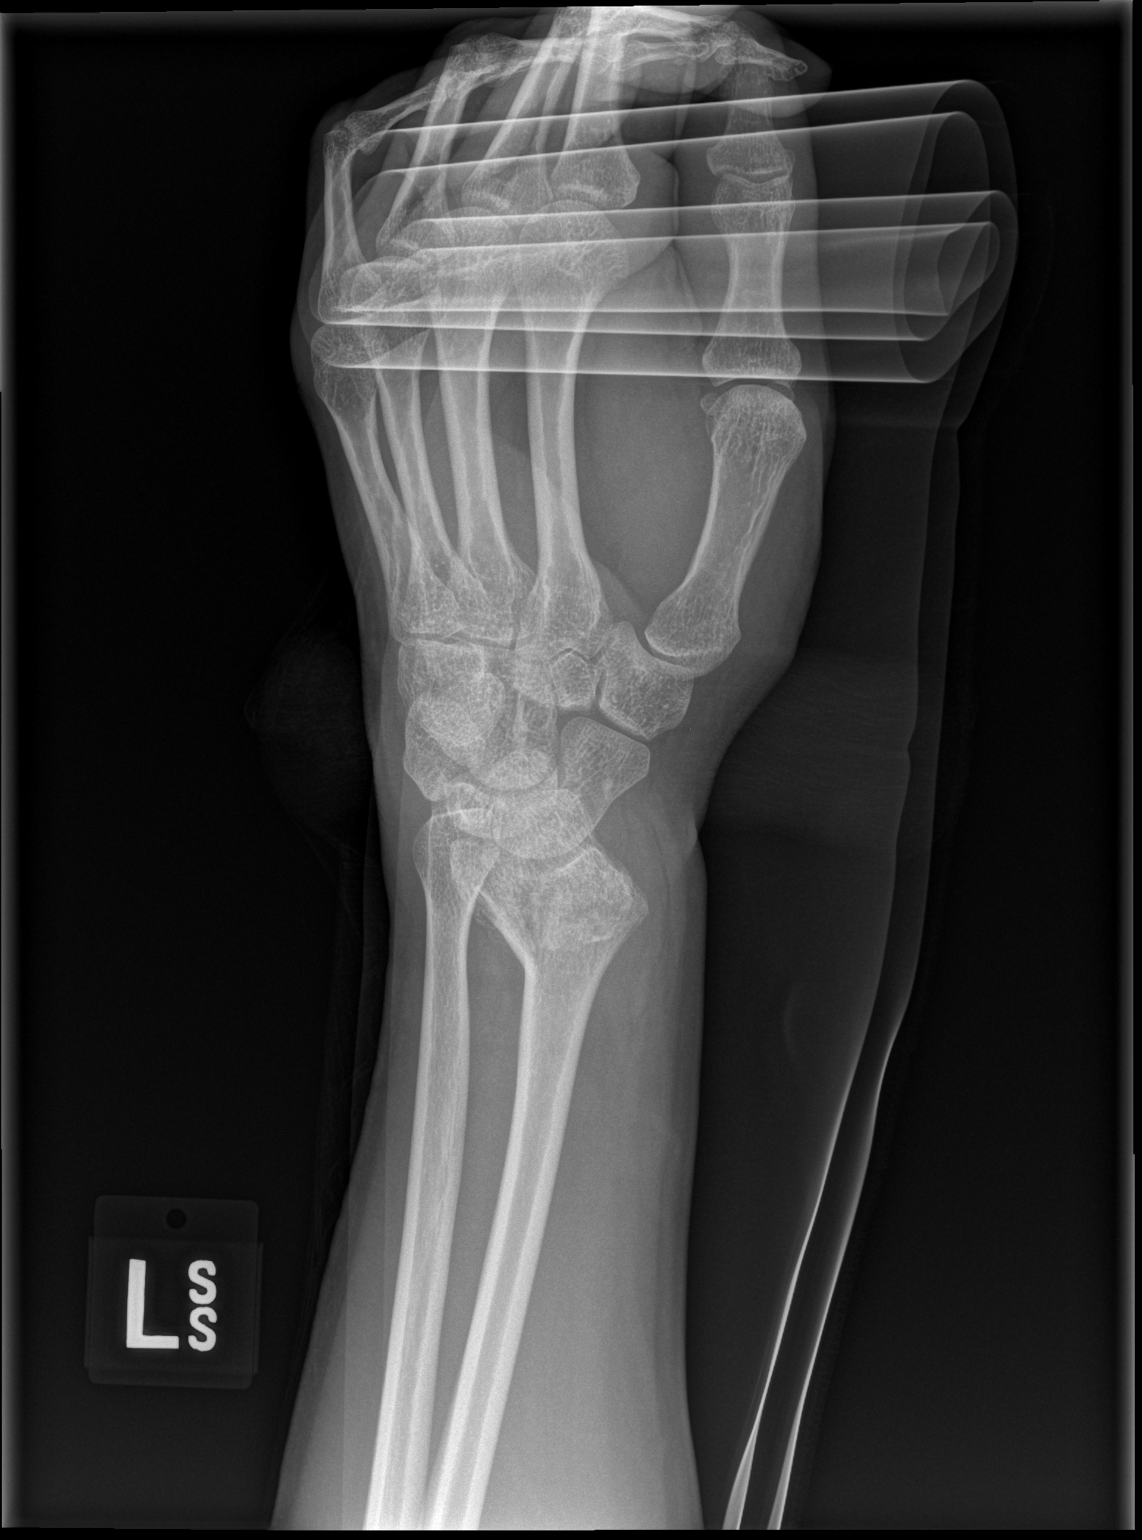

[x wrist lat left]
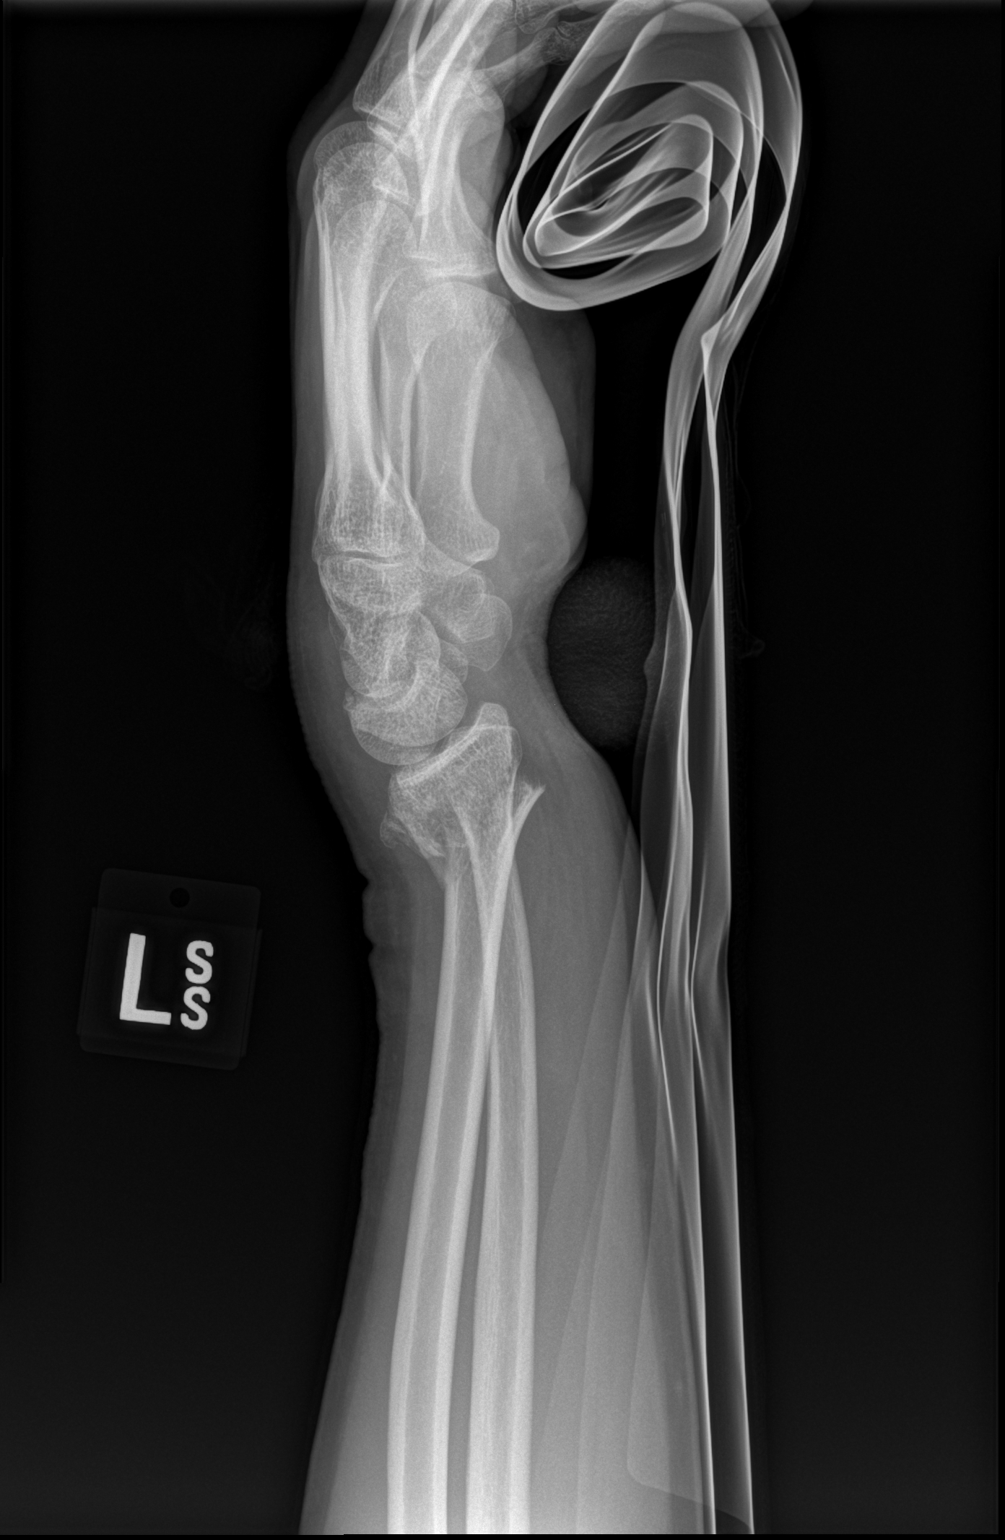

[x wrist navicular view left]
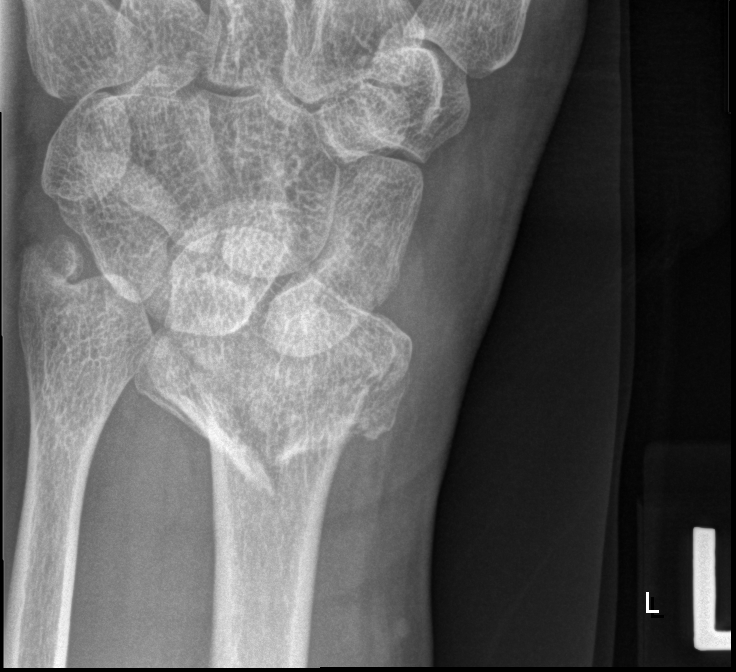

[4 of 4 positions shown; findings below may reference images not displayed]

FINDINGS: There is a comminuted and mildly impacted fracture of the distal
radial metaphysis, with dorsal displacement and angulation. A mildly
displaced ulnar styloid fracture is also noted.

Soft tissue swelling is noted about the wrist. The carpal rows
appear grossly intact, and demonstrate normal alignment,
articulating with the distal radial fragments.
IMPRESSION: Comminuted and mildly impacted fracture of the distal radial
metaphysis, with dorsal displacement and angulation. Mildly
displaced ulnar styloid fracture also noted.

## 2017-05-13 IMAGING — DX DG WRIST COMPLETE 3+V*L*
2 series · 2 of 2 positions shown · non-contrast
Comparison: February 09, 2016

CLINICAL DATA: Post reduction.  Wrist fracture.

EXAM:
LEFT WRIST - COMPLETE 3+ VIEW

[wrist pa]
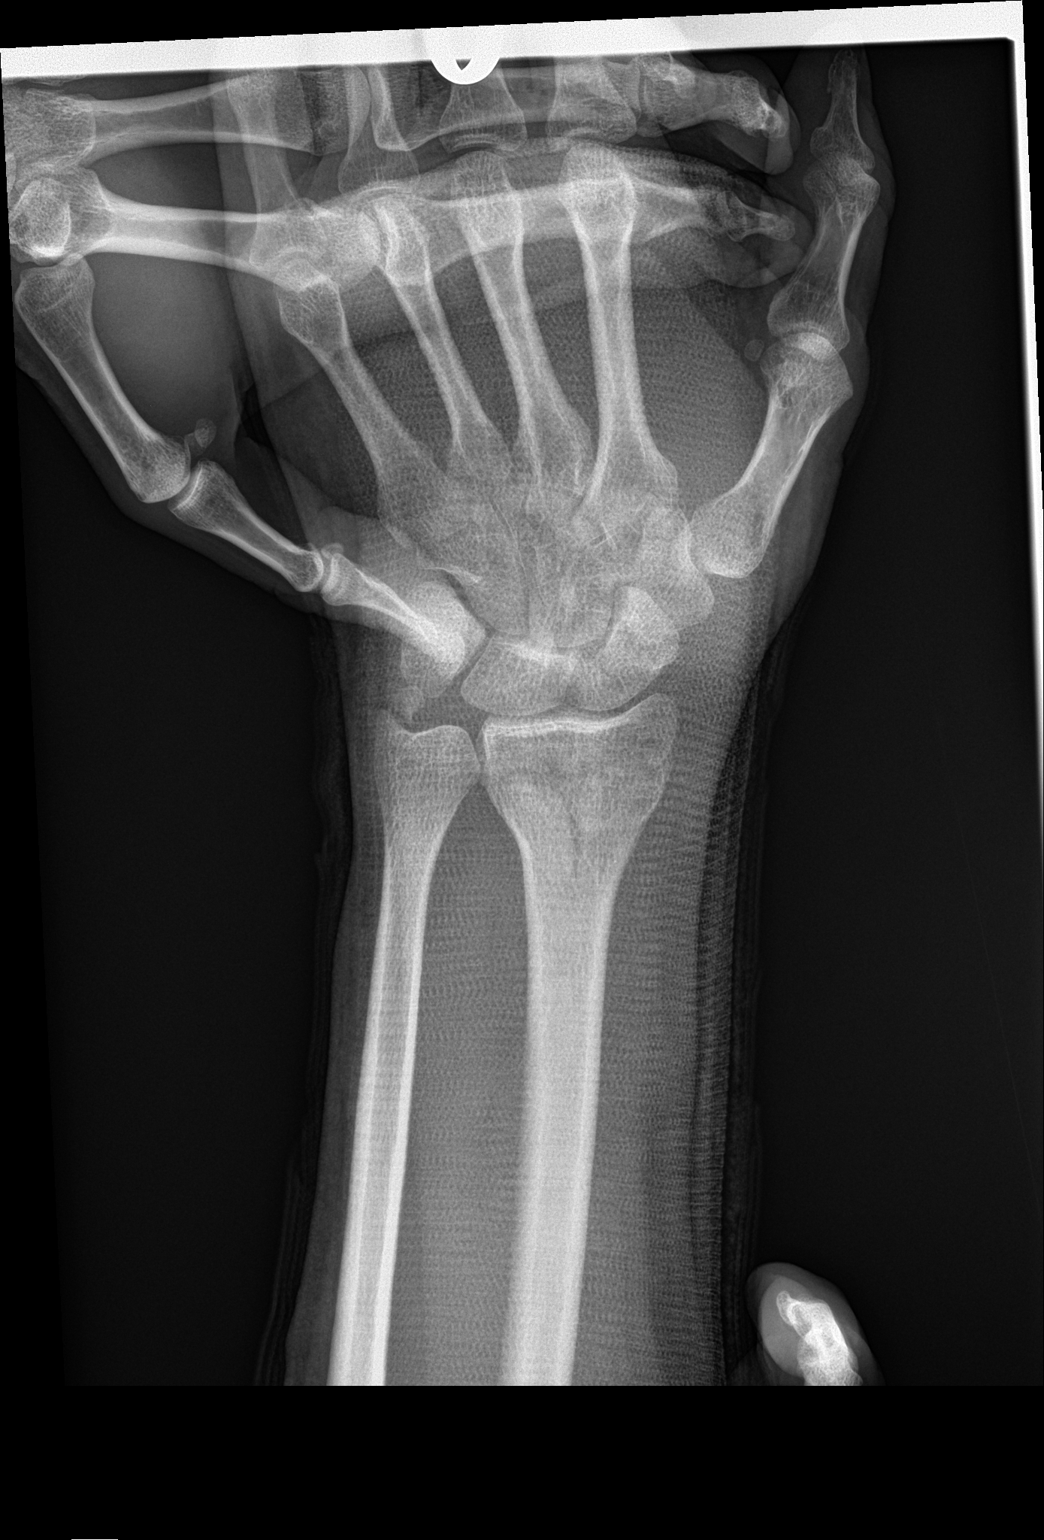

[wrist lat]
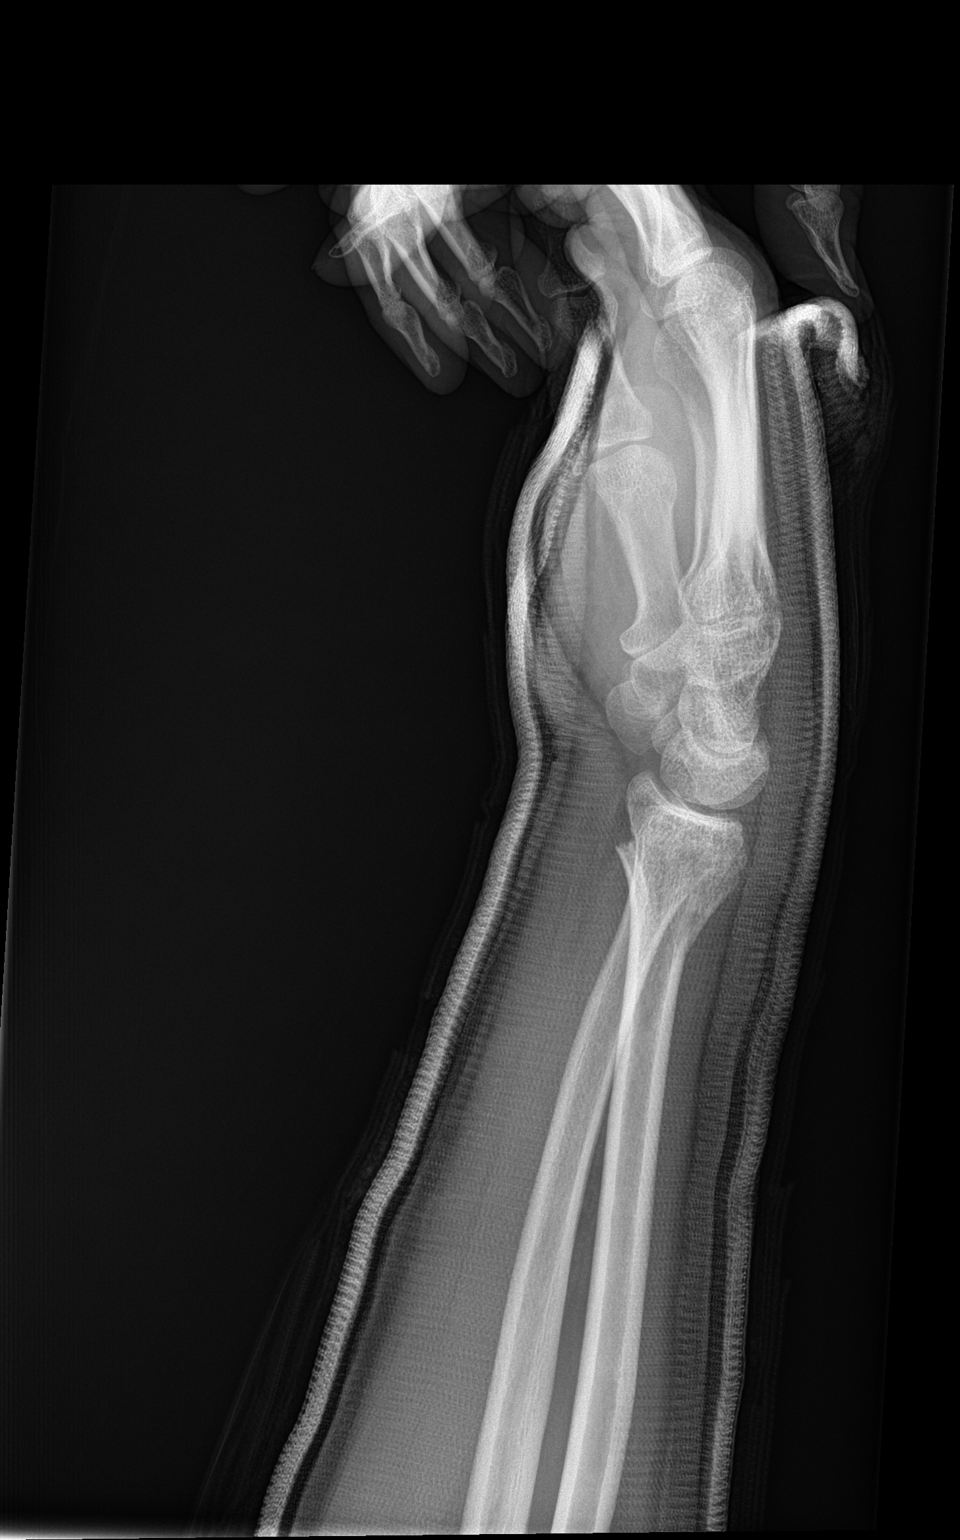

[2 of 2 positions shown; findings below may reference images not displayed]

FINDINGS: The distal radius fracture has been reduced in the interval with
significant improvement in alignment. An ulnar styloid fracture is
again identified. No other interval changes.
IMPRESSION: Reduction of the distal forearm fractures.

## 2017-05-17 IMAGING — RF DG C-ARM 61-120 MIN
1 series · 3 of 3 positions shown · non-contrast
Comparison: 02/09/2016

CLINICAL DATA: Imaging during ORIF of a left distal radius
fracture.

EXAM:
LEFT WRIST - 2 VIEW; DG C-ARM 61-120 MIN

[Series 1: run · 3 of 3 slices shown]
[im 1/3]
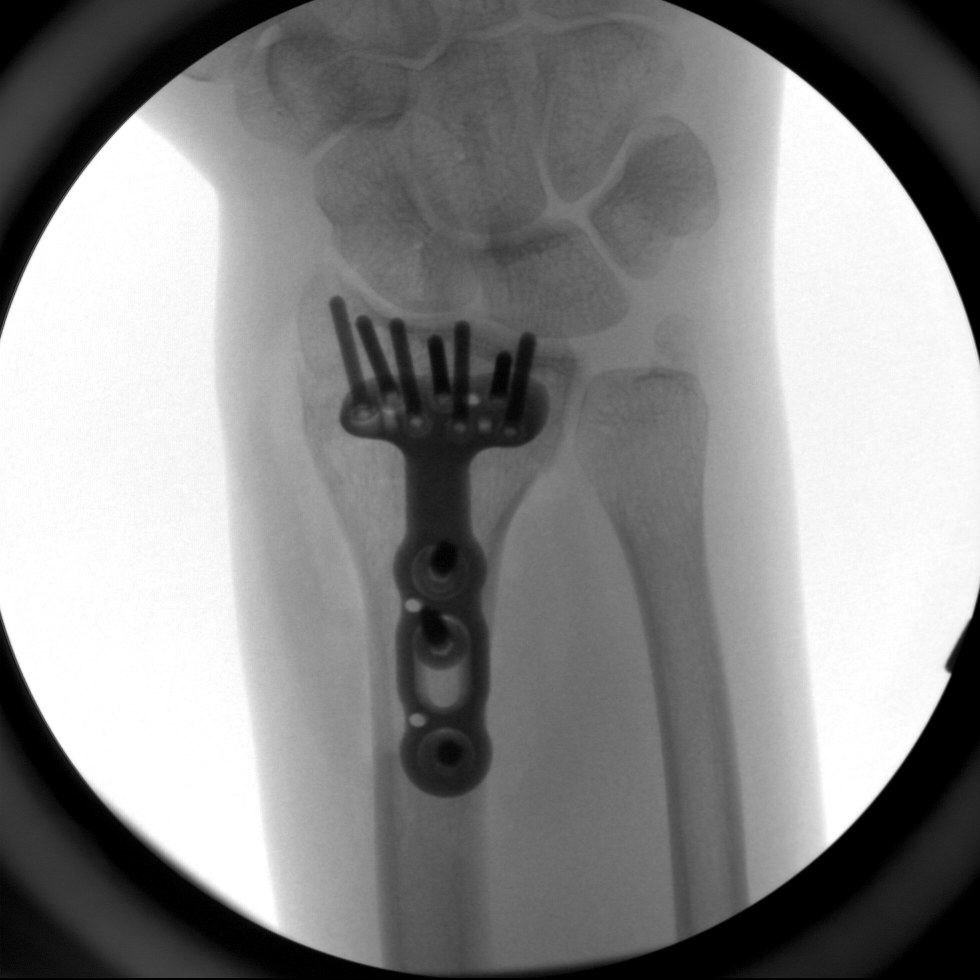
[im 2/3]
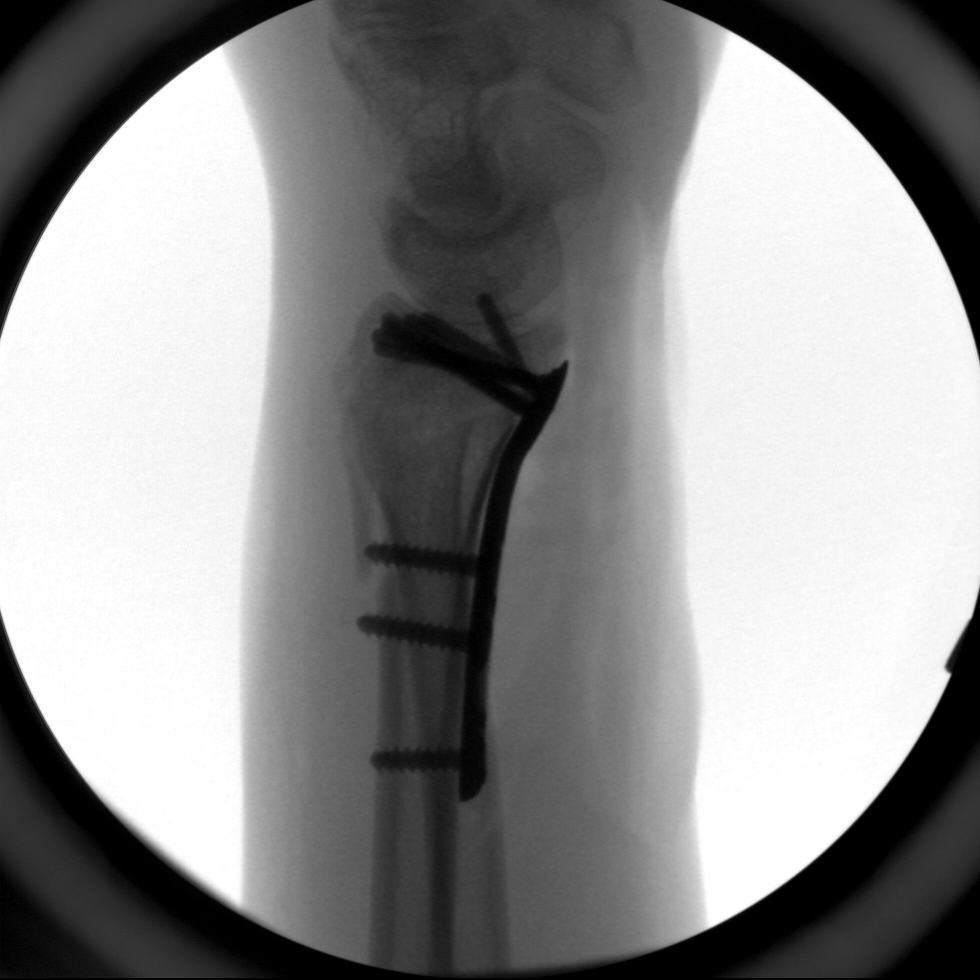
[im 3/3]
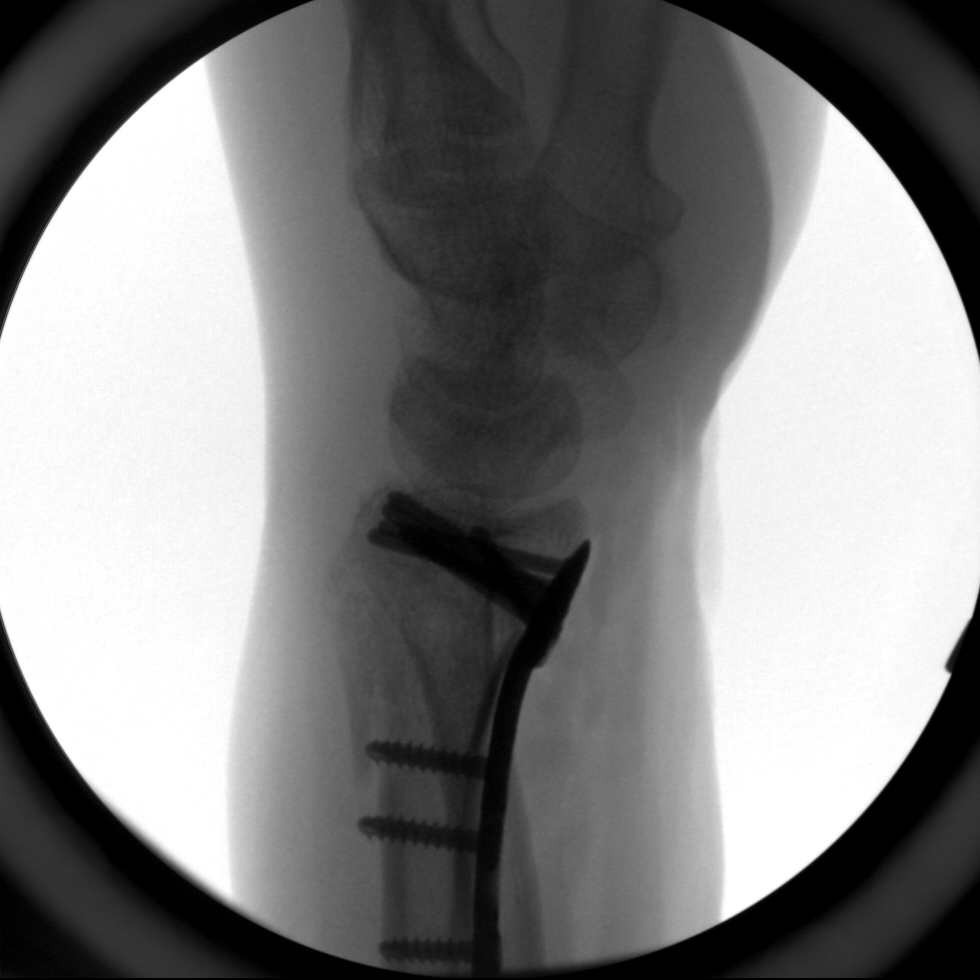

[3 of 3 positions shown; findings below may reference images not displayed]

FINDINGS: Imaging shows placement of a volar fixation plate and multiple
fixation screws. These reduce the distal radial fracture into near
anatomic alignment. The dorsal angulation of the articular surface
noted previously has also been reduced, now also near anatomic.

No new fracture or evidence of an operative complication. Ulnar
styloid fracture is unchanged.
IMPRESSION: Well-aligned distal left radial fracture following ORIF.

## 2018-03-30 ENCOUNTER — Ambulatory Visit (HOSPITAL_COMMUNITY)
Admission: EM | Admit: 2018-03-30 | Discharge: 2018-03-30 | Disposition: A | Discharge status: Home / Self Care | Payer: 59 | Attending: Internal Medicine | Admitting: Internal Medicine

## 2018-03-30 ENCOUNTER — Ambulatory Visit (INDEPENDENT_AMBULATORY_CARE_PROVIDER_SITE_OTHER): Payer: 59

## 2018-03-30 ENCOUNTER — Encounter (HOSPITAL_COMMUNITY): Payer: Self-pay

## 2018-03-30 DIAGNOSIS — R911 Solitary pulmonary nodule: Secondary | ICD-10-CM

## 2018-03-30 DIAGNOSIS — J209 Acute bronchitis, unspecified: Secondary | ICD-10-CM

## 2018-03-30 DIAGNOSIS — R05 Cough: Secondary | ICD-10-CM | POA: Diagnosis not present

## 2018-03-30 MED ORDER — BENZONATATE 100 MG PO CAPS: 100.0000 mg | ORAL_CAPSULE | Freq: Three times a day (TID) | ORAL | 0 refills | Status: DC | PRN

## 2018-03-30 NOTE — ED Provider Notes (Signed)
[facility]   [id number] 03/30/18 Arrival Time: 0759  SUBJECTIVE:  Timothy Levine is a 42 y.o. male, tobacco abuse 1/2 PPD, who presents with cough x 5 days.  Admits to positive sick exposure to close contact with walking pneumonia.  Describes cough as intermittent with sputum production  Has tried nyquil and cough medication without relief.  Denies aggravating factors.  Reports previous symptoms in the past.   Complains of subjective fever, chills, and fatigue.  Denies sinus pain, rhinorrhea, sore throat, SOB, wheezing, chest pain, nausea, changes in bowel or bladder habits.    ROS: As per HPI.  Past Medical History:  Diagnosis Date  . Distal radius fracture, left 02/08/2016   Past Surgical History:  Procedure Laterality Date  . OPEN REDUCTION INTERNAL FIXATION (ORIF) DISTAL RADIAL FRACTURE Left 02/13/2016   Procedure: OPEN TREATMENT OF LEFT DISTAL RADIUS FRACTURE;  Surgeon: Mack Hookavid Thompson, MD;  Location: [facility];  Service: Orthopedics;  Laterality: Left;  . TONSILLECTOMY AND ADENOIDECTOMY     No Known Allergies No current facility-administered medications on file prior to encounter.    No current outpatient medications on file prior to encounter.    Social History   Socioeconomic History  . Marital status: Single    Spouse name: Not on file  . Number of children: Not on file  . Years of education: Not on file  . Highest education level: Not on file  Occupational History  . Not on file  Social Needs  . Financial resource strain: Not on file  . Food insecurity:    Worry: Not on file    Inability: Not on file  . Transportation needs:    Medical: Not on file    Non-medical: Not on file  Tobacco Use  . Smoking status: Current Every Day Smoker    Packs/day: 0.00    Years: 14.00    Pack years: 0.00    Types: Cigarettes  . Smokeless tobacco: Never Used  . Tobacco comment: 1/3 pack/day  Substance and Sexual Activity  . Alcohol use: Yes    Comment:  occasionally  . Drug use: No  . Sexual activity: Not on file  Lifestyle  . Physical activity:    Days per week: Not on file    Minutes per session: Not on file  . Stress: Not on file  Relationships  . Social connections:    Talks on phone: Not on file    Gets together: Not on file    Attends religious service: Not on file    Active member of club or organization: Not on file    Attends meetings of clubs or organizations: Not on file    Relationship status: Not on file  . Intimate partner violence:    Fear of current or ex partner: Not on file    Emotionally abused: Not on file    Physically abused: Not on file    Forced sexual activity: Not on file  Other Topics Concern  . Not on file  Social History Narrative  . Not on file   History reviewed. No pertinent family history.   OBJECTIVE:  Vitals:   03/30/18 0817  BP: 114/74  Pulse: 100  Resp: 20  Temp: 98.5 F (36.9 C)  TempSrc: Oral  SpO2: 97%     General appearance: AOx3 in no acute distress; appears fatigued; nontoxic HEENT: EACs cerumen present, TMs partial visible without erythema; PERRL.  EOM grossly intact.  Sinuses nontender; mild clear rhinorrhea; tonsils  nonerythematous, uvula midline Neck: supple without LAD Lungs: clear to auscultation bilaterally without adventitious breath sounds with persistent cough during examination Heart: regular rate and rhythm.  Radial pulses 2+ symmetrical bilaterally Skin: warm and dry Psychological: alert and cooperative; normal mood and affect  IMAGING:   CLINICAL DATA: Five days of productive cough. Current smoker. No cardiopulmonary history.  EXAM: CHEST - 2 VIEW  COMPARISON: None in PACs  FINDINGS: The lungs are adequately inflated. The interstitial markings are mildly prominent. There is no alveolar infiltrate or pleural effusion. There is an approximately 4 mm diameter soft tissue density nodule that projects between the posterior aspects of the seventh  and eighth left ribs. There is top-normal cardiac size. The trachea is midline. The bony thorax exhibits no acute abnormality. There is gentle curvature convex toward the right centered in the lower thoracic spine.  IMPRESSION: Mild chronic bronchitic-smoking related change. Subtle nodular density projects in the left mid lung posteriorly. A chest CT scanning is recommended to exclude occult malignancy.  Electronically Signed By: David Swaziland M.D. On: 03/30/2018 08:55  ASSESSMENT & PLAN:  1. Acute bronchitis, unspecified organism   2. Lung nodule     Meds ordered this encounter  Medications  . benzonatate (TESSALON) 100 MG capsule    Sig: Take 1 capsule (100 mg total) by mouth every 8 (eight) hours as needed for cough.    Dispense:  21 capsule    Refill:  0    Order Specific Question:   Supervising Provider    Answer:   Isa Rankin [[id number]]   Chest x-ray showed bronchitis and small nodule.  Radiology recommended a chest CT scan for further evaluation and management.  Please call Primary Care at [facility] or [facility] to schedule an appointment as soon as possible.   Get plenty of rest and push fluids Use OTC medication as needed for symptomatic relief Prescribed tessolone perles as needed for cough Return or go to ER if you have any new or worsening symptoms   Reviewed expectations re: course of current medical issues. Questions answered. Outlined signs and symptoms indicating need for more acute intervention. Patient verbalized understanding. After Visit Summary given.          [name], PA-C 03/30/18 [phone]

## 2018-03-30 NOTE — Discharge Instructions (Addendum)
Chest x-ray showed bronchitis and small nodule.  Recommend a chest CT scan for further evaluation and management.  Please call [facility] at [facility] or [facility] to schedule an appointment as soon as possible.   Get plenty of rest and push fluids Use OTC medication as needed for symptomatic relief Prescribed tessolone perles as needed for cough Return or go to ER if you have any new or worsening symptoms

## 2018-03-30 NOTE — ED Triage Notes (Signed)
Pt presents with ongoing cough x5 days

## 2018-04-04 ENCOUNTER — Ambulatory Visit: Payer: 59 | Admitting: Family Medicine

## 2018-04-04 ENCOUNTER — Encounter: Payer: Self-pay | Admitting: Family Medicine

## 2018-04-04 ENCOUNTER — Other Ambulatory Visit: Payer: Self-pay

## 2018-04-04 VITALS — BP 130/90 | HR 93 | Temp 97.6°F | Ht 73.0 in | Wt 182.2 lb

## 2018-04-04 DIAGNOSIS — R059 Cough, unspecified: Secondary | ICD-10-CM

## 2018-04-04 DIAGNOSIS — R918 Other nonspecific abnormal finding of lung field: Secondary | ICD-10-CM

## 2018-04-04 DIAGNOSIS — F17200 Nicotine dependence, unspecified, uncomplicated: Secondary | ICD-10-CM | POA: Diagnosis not present

## 2018-04-04 DIAGNOSIS — R05 Cough: Secondary | ICD-10-CM | POA: Diagnosis not present

## 2018-04-04 DIAGNOSIS — R062 Wheezing: Secondary | ICD-10-CM | POA: Diagnosis not present

## 2018-04-04 DIAGNOSIS — J22 Unspecified acute lower respiratory infection: Secondary | ICD-10-CM

## 2018-04-04 MED ORDER — ALBUTEROL SULFATE HFA 108 (90 BASE) MCG/ACT IN AERS: 1.0000 | INHALATION_SPRAY | RESPIRATORY_TRACT | 0 refills | Status: DC | PRN

## 2018-04-04 MED ORDER — AZITHROMYCIN 250 MG PO TABS: ORAL_TABLET | ORAL | 0 refills | Status: DC

## 2018-04-04 NOTE — Patient Instructions (Addendum)
Due to continued congestion and some worsening congestion it is reasonable to go ahead and start an antibiotic at this time.  I have sent azithromycin to your pharmacy.  Additionally for wheezing you can use the albuterol inhaler up to every 4-6 hours.  If you do require that more than a couple of times per day, or persistently need that more than 3 days in a row, return to discuss other medications.  See information on quitting smoking below.  There are some prescription options to help with quitting smoking, and can discuss this further next visit if you would like.  On your recent chest x-ray, there was a 4 mm density or slight abnormality on the left side of your lungs.  That can be due to a number of causes, including possible infection, but with smoking history I do feel like it is important to check that out further with a CT scan.  I have ordered that test and they should be contacting you to have a scheduled..   Please follow-up with me in the next 3 to 4 weeks,Return to the clinic or go to the nearest emergency room if any of your symptoms worsen or new symptoms occur.   Steps to Quit Smoking Smoking tobacco can be bad for your health. It can also affect almost every organ in your body. Smoking puts you and people around you at risk for many serious long-lasting (chronic) diseases. Quitting smoking is hard, but it is one of the best things that you can do for your health. It is never too late to quit. What are the benefits of quitting smoking? When you quit smoking, you lower your risk for getting serious diseases and conditions. They can include:  Lung cancer or lung disease.  Heart disease.  Stroke.  Heart attack.  Not being able to have children (infertility).  Weak bones (osteoporosis) and broken bones (fractures).  If you have coughing, wheezing, and shortness of breath, those symptoms may get better when you quit. You may also get sick less often. If you are pregnant,  quitting smoking can help to lower your chances of having a baby of low birth weight. What can I do to help me quit smoking? Talk with your doctor about what can help you quit smoking. Some things you can do (strategies) include:  Quitting smoking totally, instead of slowly cutting back how much you smoke over a period of time.  Going to in-person counseling. You are more likely to quit if you go to many counseling sessions.  Using resources and support systems, such as: ? Agricultural engineer with a Veterinary surgeon. ? Phone quitlines. ? Automotive engineer. ? Support groups or group counseling. ? Text messaging programs. ? Mobile phone apps or applications.  Taking medicines. Some of these medicines may have nicotine in them. If you are pregnant or breastfeeding, do not take any medicines to quit smoking unless your doctor says it is okay. Talk with your doctor about counseling or other things that can help you.  Talk with your doctor about using more than one strategy at the same time, such as taking medicines while you are also going to in-person counseling. This can help make quitting easier. What things can I do to make it easier to quit? Quitting smoking might feel very hard at first, but there is a lot that you can do to make it easier. Take these steps:  Talk to your family and friends. Ask them to support and encourage you.  Call phone quitlines, reach out to support groups, or work with a Veterinary surgeon.  Ask people who smoke to not smoke around you.  Avoid places that make you want (trigger) to smoke, such as: ? Bars. ? Parties. ? Smoke-break areas at work.  Spend time with people who do not smoke.  Lower the stress in your life. Stress can make you want to smoke. Try these things to help your stress: ? Getting regular exercise. ? Deep-breathing exercises. ? Yoga. ? Meditating. ? Doing a body scan. To do this, close your eyes, focus on one area of your body at a time from head  to toe, and notice which parts of your body are tense. Try to relax the muscles in those areas.  Download or buy apps on your mobile phone or tablet that can help you stick to your quit plan. There are many free apps, such as QuitGuide from the [employer] developer for Disease Control and Prevention). You can find more support from smokefree.gov and other websites.  This information is not intended to replace advice given to you by your health care provider. Make sure you discuss any questions you have with your health care provider. Document Released: 06/13/2009 Document Revised: 04/14/2016 Document Reviewed: 01/01/2015 Elsevier Interactive Patient Education  2018 [employer].  Bronchospasm, Adult Bronchospasm is a tightening of the airways going into the lungs. During an episode, it may be harder to breathe. You may cough, and you may make a whistling sound when you breathe (wheeze). This condition often affects people with asthma. What are the causes? This condition is caused by swelling and irritation in the airways. It can be triggered by:  An infection (common).  Seasonal allergies.  An allergic reaction.  Exercise.  Irritants. These include pollution, cigarette smoke, strong odors, aerosol sprays, and paint fumes.  Weather changes. Winds increase molds and pollens in the air. Cold air may cause swelling.  Stress and emotional upset.  What are the signs or symptoms? Symptoms of this condition include:  Wheezing. If the episode was triggered by an allergy, wheezing may start right away or hours later.  Nighttime coughing.  Frequent or severe coughing with a simple cold.  Chest tightness.  Shortness of breath.  Decreased ability to exercise.  How is this diagnosed? This condition is usually diagnosed with a review of your medical history and a physical exam. Tests, such as lung function tests, are sometimes done to look for other conditions. The need for a chest X-ray  depends on where the wheezing occurs and whether it is the first time you have wheezed. How is this treated? This condition may be treated with:  Inhaled medicines. These open up the airways and help you breathe. They can be taken with an inhaler or a nebulizer device.  Corticosteroid medicines. These may be given for severe bronchospasm, usually when it is associated with asthma.  Avoiding triggers, such as irritants, infection, or allergies.  Follow these instructions at home: Medicines  Take over-the-counter and prescription medicines only as told by your health care provider.  If you need to use an inhaler or nebulizer to take your medicine, ask your health care provider to explain how to use it correctly. If you were given a spacer, always use it with your inhaler. Lifestyle  Reduce the number of triggers in your home. To do this: ? Change your heating and air conditioning filter at least once a month. ? Limit your use of fireplaces and wood stoves. ?  Do not smoke. Do not allow smoking in your home. ? Avoid using perfumes and fragrances. ? Get rid of pests, such as roaches and mice, and their droppings. ? Remove any mold from your home. ? Keep your house clean and dust free. Use unscented cleaning products. ? Replace carpet with wood, tile, or vinyl flooring. Carpet can trap dander and dust. ? Use allergy-proof pillows, mattress covers, and box spring covers. ? Wash bed sheets and blankets every week in hot water. Dry them in a dryer. ? Use blankets that are made of polyester or cotton. ? Wash your hands often. ? Do not allow pets in your bedroom.  Avoid breathing in cold air when you exercise. General instructions  Have a plan for seeking medical care. Know when to call your health care provider and local emergency services, and where to get emergency care.  Stay up to date on your immunizations.  When you have an episode of bronchospasm, stay calm. Try to relax and  breathe more slowly.  If you have asthma, make sure you have an asthma action plan.  Keep all follow-up visits as told by your health care provider. This is important. Contact a health care provider if:  You have muscle aches.  You have chest pain.  The mucus that you cough up (sputum) changes from clear or white to yellow, green, gray, or bloody.  You have a fever.  Your sputum gets thicker. Get help right away if:  Your wheezing and coughing get worse, even after you take your prescribed medicines.  It gets even harder to breathe.  You develop severe chest pain. Summary  Bronchospasm is a tightening of the airways going into the lungs.  During an episode of bronchospasm, you may have a harder time breathing. You may cough and make a whistling sound when you breathe (wheeze).  Avoid exposure to triggers such as smoke, dust, mold, animal dander, and fragrances.  When you have an episode of bronchospasm, stay calm. Try to relax and breathe more slowly. This information is not intended to replace advice given to you by your health care provider. Make sure you discuss any questions you have with your health care provider. Document Released: 08/20/2003 Document Revised: 08/13/2016 Document Reviewed: 08/13/2016 Elsevier Interactive Patient Education  2017 [employer] Inc[employer]   Cough, Adult Coughing is a reflex that clears your throat and your airways. Coughing helps to heal and protect your lungs. It is normal to cough occasionally, but a cough that happens with other symptoms or lasts a long time may be a sign of a condition that needs treatment. A cough may last only 2-3 weeks (acute), or it may last longer than 8 weeks (chronic). What are the causes? Coughing is commonly caused by:  Breathing in substances that irritate your lungs.  A viral or bacterial respiratory infection.  Allergies.  Asthma.  Postnasal drip.  Smoking.  Acid backing up from the stomach into the  esophagus (gastroesophageal reflux).  Certain medicines.  Chronic lung problems, including COPD (or rarely, lung cancer).  Other medical conditions such as heart failure.  Follow these instructions at home: Pay attention to any changes in your symptoms. Take these actions to help with your discomfort:  Take medicines only as told by your health care provider. ? If you were prescribed an antibiotic medicine, take it as told by your health care provider. Do not stop taking the antibiotic even if you start to feel better. ? Talk with your health care  provider before you take a cough suppressant medicine.  Drink enough fluid to keep your urine clear or pale yellow.  If the air is dry, use a cold steam vaporizer or humidifier in your bedroom or your home to help loosen secretions.  Avoid anything that causes you to cough at work or at home.  If your cough is worse at night, try sleeping in a semi-upright position.  Avoid cigarette smoke. If you smoke, quit smoking. If you need help quitting, ask your health care provider.  Avoid caffeine.  Avoid alcohol.  Rest as needed.  Contact a health care provider if:  You have new symptoms.  You cough up pus.  Your cough does not get better after 2-3 weeks, or your cough gets worse.  You cannot control your cough with suppressant medicines and you are losing sleep.  You develop pain that is getting worse or pain that is not controlled with pain medicines.  You have a fever.  You have unexplained weight loss.  You have night sweats. Get help right away if:  You cough up blood.  You have difficulty breathing.  Your heartbeat is very fast. This information is not intended to replace advice given to you by your health care provider. Make sure you discuss any questions you have with your health care provider. Document Released: 02/13/2011 Document Revised: 01/23/2016 Document Reviewed: 10/24/2014 Elsevier Interactive Patient  Education  2018 [employer]   IF you received an x-ray today, you will receive an invoice from [facility]. Please contact [facility] at [phone] with questions or concerns regarding your invoice.   IF you received labwork today, you will receive an invoice from [facility]. Please contact [facility] at [phone] with questions or concerns regarding your invoice.   Our billing staff will not be able to assist you with questions regarding bills from these companies.  You will be contacted with the lab results as soon as they are available. The fastest way to get your results is to activate your My Chart account. Instructions are located on the last page of this paperwork. If you have not heard from us regarding the results in 2 weeks, please contact this office.

## 2018-04-04 NOTE — Progress Notes (Signed)
Subjective:    Patient ID: Timothy Levine, male    DOB: 1976-06-29, 42 y.o.   MRN: [id number]  HPI Timothy Levine is a 42 y.o. male Presents today for: Chief Complaint  Patient presents with  . Bronchitis    according to Urgent care last Wed. Request a referral ct scan for the nodule on his lung   Here for follow-up after recent urgent care visit on March 30, 2018.  At that time he was presenting with a cough for 5 days.  Did have a sick contact with walking pneumonia.  Tried NyQuil and cough medication without relief.  Subjective fever chills and fatigue.  Positive tobacco history with 1/2 pack/day.  He obtain an x-ray that showed mild chronic bronchitic with smoking-related changes.  There was a subtle nodular density in the left mid lung posteriorly and a CT chest was recommended to exclude occult malignancy.  He was treated with Tessalon for the cough, symptomatic care with rest and fluids.  Cough is improving.  Still some congestion. post tussive emesis initially, but not currently.   Feels more congested, but not as bad of coughing fits. Eating and drinking ok.  No unexplained weight loss/night sweats/fever/chills. White mucus production. No hemoptysis.   SH: Works at [employer].  Tobacco: 1/2ppd for 24 years (about 12 pack year hx). Remote attempt at quitting. Considering quitting now. Mom passed away 3 years ago with stomach cancer. 36 year old daughter.   GF's son had walking PNA few weeks ago.   There are no active problems to display for this patient.  Past Medical History:  Diagnosis Date  . Distal radius fracture, left 02/08/2016   Past Surgical History:  Procedure Laterality Date  . OPEN REDUCTION INTERNAL FIXATION (ORIF) DISTAL RADIAL FRACTURE Left 02/13/2016   Procedure: OPEN TREATMENT OF LEFT DISTAL RADIUS FRACTURE;  Surgeon: Mack Hook, MD;  Location: [facility];  Service: Orthopedics;  Laterality: Left;  . TONSILLECTOMY AND ADENOIDECTOMY     No Known  Allergies Prior to Admission medications   Medication Sig Start Date End Date Taking? Authorizing Provider  benzonatate (TESSALON) 100 MG capsule Take 1 capsule (100 mg total) by mouth every 8 (eight) hours as needed for cough. 03/30/18  Yes Wurst, [place], PA-C   Social History   Socioeconomic History  . Marital status: Single    Spouse name: Not on file  . Number of children: Not on file  . Years of education: Not on file  . Highest education level: Not on file  Occupational History  . Not on file  Social Needs  . Financial resource strain: Not on file  . Food insecurity:    Worry: Not on file    Inability: Not on file  . Transportation needs:    Medical: Not on file    Non-medical: Not on file  Tobacco Use  . Smoking status: Current Every Day Smoker    Packs/day: 0.00    Years: 14.00    Pack years: 0.00    Types: Cigarettes  . Smokeless tobacco: Never Used  . Tobacco comment: 1/3 pack/day  Substance and Sexual Activity  . Alcohol use: Yes    Comment: occasionally  . Drug use: No  . Sexual activity: Not on file  Lifestyle  . Physical activity:    Days per week: Not on file    Minutes per session: Not on file  . Stress: Not on file  Relationships  . Social connections:  Talks on phone: Not on file    Gets together: Not on file    Attends religious service: Not on file    Active member of club or organization: Not on file    Attends meetings of clubs or organizations: Not on file    Relationship status: Not on file  . Intimate partner violence:    Fear of current or ex partner: Not on file    Emotionally abused: Not on file    Physically abused: Not on file    Forced sexual activity: Not on file  Other Topics Concern  . Not on file  Social History Narrative  . Not on file    Review of Systems  Constitutional: Negative for chills, diaphoresis, fever and unexpected weight change.  Respiratory: Positive for cough, shortness of breath (on occasion. ) and  wheezing (at times. has used GF's inhaler with relief at times. not with current illness. ).       Objective:   Physical Exam  Constitutional: He is oriented to person, place, and time. He appears well-developed and well-nourished.  HENT:  Head: Normocephalic and atraumatic.  Right Ear: Tympanic membrane, external ear and ear canal normal.  Left Ear: Tympanic membrane, external ear and ear canal normal.  Nose: No rhinorrhea.  Mouth/Throat: Oropharynx is clear and moist and mucous membranes are normal. No oropharyngeal exudate or posterior oropharyngeal erythema.  Eyes: Pupils are equal, round, and reactive to light. Conjunctivae are normal.  Neck: Neck supple.  Cardiovascular: Normal rate, regular rhythm, normal heart sounds and intact distal pulses.  No murmur heard. Pulmonary/Chest: Effort normal. He has wheezes (faint, clears with cough. o/w clear exam. ). He has no rhonchi. He has no rales.  Abdominal: Soft. There is no tenderness.  Lymphadenopathy:    He has no cervical adenopathy.  Neurological: He is alert and oriented to person, place, and time.  Skin: Skin is warm and dry. No rash noted.  Psychiatric: He has a normal mood and affect. His behavior is normal.  Vitals reviewed.   Vitals:   04/04/18 0833 04/04/18 0835  BP: (!) 138/98 130/90  Pulse: 93   Temp: 97.6 F (36.4 C)   TempSrc: Oral   SpO2: 97%   Weight: 182 lb 3.2 oz (82.6 kg)   Height: 6\' 1"  (1.854 m)       Assessment & Plan:    Timothy Levine is a 42 y.o. male Cough - Plan: CT Chest W Contrast, azithromycin (ZITHROMAX) 250 MG tablet  Abnormal findings on diagnostic imaging of lung - Plan: CT Chest W Contrast, azithromycin (ZITHROMAX) 250 MG tablet  Tobacco use disorder - Plan: CT Chest W Contrast  Wheeze - Plan: albuterol (PROVENTIL HFA;VENTOLIN HFA) 108 (90 Base) MCG/ACT inhaler  LRTI (lower respiratory tract infection) - Plan: azithromycin (ZITHROMAX) 250 MG tablet  Possible viral bronchitis but with  abnormality seen on chest x-ray, focal nodularity versus bacterial possibility/atypicals.  -Check CT lungs to rule out nodule/tumor given tobacco use history.  -Continue Tessalon Perles, albuterol inhaler given if needed for wheezing.  RTC precautions if frequent/persistent use.  -Handout given on tobacco cessation.  Follow-up in 4 weeks to discuss further and depending on CT scanning.    Meds ordered this encounter  Medications  . albuterol (PROVENTIL HFA;VENTOLIN HFA) 108 (90 Base) MCG/ACT inhaler    Sig: Inhale 1-2 puffs into the lungs every 4 (four) hours as needed for wheezing or shortness of breath.    Dispense:  1 Inhaler  Refill:  0  . azithromycin (ZITHROMAX) 250 MG tablet    Sig: Take 2 pills by mouth on day 1, then 1 pill by mouth per day on days 2 through 5.    Dispense:  6 tablet    Refill:  0   Patient Instructions   Due to continued congestion and some worsening congestion it is reasonable to go ahead and start an antibiotic at this time.  I have sent azithromycin to your pharmacy.  Additionally for wheezing you can use the albuterol inhaler up to every 4-6 hours.  If you do require that more than a couple of times per day, or persistently need that more than 3 days in a row, return to discuss other medications.  See information on quitting smoking below.  There are some prescription options to help with quitting smoking, and can discuss this further next visit if you would like.  On your recent chest x-ray, there was a 4 mm density or slight abnormality on the left side of your lungs.  That can be due to a number of causes, including possible infection, but with smoking history I do feel like it is important to check that out further with a CT scan.  I have ordered that test and they should be contacting you to have a scheduled..   Please follow-up with me in the next 3 to 4 weeks,Return to the clinic or go to the nearest emergency room if any of your symptoms worsen or new  symptoms occur.   Steps to Quit Smoking Smoking tobacco can be bad for your health. It can also affect almost every organ in your body. Smoking puts you and people around you at risk for many serious long-lasting (chronic) diseases. Quitting smoking is hard, but it is one of the best things that you can do for your health. It is never too late to quit. What are the benefits of quitting smoking? When you quit smoking, you lower your risk for getting serious diseases and conditions. They can include:  Lung cancer or lung disease.  Heart disease.  Stroke.  Heart attack.  Not being able to have children (infertility).  Weak bones (osteoporosis) and broken bones (fractures).  If you have coughing, wheezing, and shortness of breath, those symptoms may get better when you quit. You may also get sick less often. If you are pregnant, quitting smoking can help to lower your chances of having a baby of low birth weight. What can I do to help me quit smoking? Talk with your doctor about what can help you quit smoking. Some things you can do (strategies) include:  Quitting smoking totally, instead of slowly cutting back how much you smoke over a period of time.  Going to in-person counseling. You are more likely to quit if you go to many counseling sessions.  Using resources and support systems, such as: ? Agricultural engineernline chats with a Veterinary surgeoncounselor. ? Phone quitlines. ? Automotive engineerrinted self-help materials. ? Support groups or group counseling. ? Text messaging programs. ? Mobile phone apps or applications.  Taking medicines. Some of these medicines may have nicotine in them. If you are pregnant or breastfeeding, do not take any medicines to quit smoking unless your doctor says it is okay. Talk with your doctor about counseling or other things that can help you.  Talk with your doctor about using more than one strategy at the same time, such as taking medicines while you are also going to in-person counseling.  This  can help make quitting easier. What things can I do to make it easier to quit? Quitting smoking might feel very hard at first, but there is a lot that you can do to make it easier. Take these steps:  Talk to your family and friends. Ask them to support and encourage you.  Call phone quitlines, reach out to support groups, or work with a Veterinary surgeon.  Ask people who smoke to not smoke around you.  Avoid places that make you want (trigger) to smoke, such as: ? Bars. ? Parties. ? Smoke-break areas at work.  Spend time with people who do not smoke.  Lower the stress in your life. Stress can make you want to smoke. Try these things to help your stress: ? Getting regular exercise. ? Deep-breathing exercises. ? Yoga. ? Meditating. ? Doing a body scan. To do this, close your eyes, focus on one area of your body at a time from head to toe, and notice which parts of your body are tense. Try to relax the muscles in those areas.  Download or buy apps on your mobile phone or tablet that can help you stick to your quit plan. There are many free apps, such as QuitGuide from the [employer] developer for Disease Control and Prevention). You can find more support from smokefree.gov and other websites.  This information is not intended to replace advice given to you by your health care provider. Make sure you discuss any questions you have with your health care provider. Document Released: 06/13/2009 Document Revised: 04/14/2016 Document Reviewed: 01/01/2015 Elsevier Interactive Patient Education  2018 [employer].  Bronchospasm, Adult Bronchospasm is a tightening of the airways going into the lungs. During an episode, it may be harder to breathe. You may cough, and you may make a whistling sound when you breathe (wheeze). This condition often affects people with asthma. What are the causes? This condition is caused by swelling and irritation in the airways. It can be triggered by:  An infection  (common).  Seasonal allergies.  An allergic reaction.  Exercise.  Irritants. These include pollution, cigarette smoke, strong odors, aerosol sprays, and paint fumes.  Weather changes. Winds increase molds and pollens in the air. Cold air may cause swelling.  Stress and emotional upset.  What are the signs or symptoms? Symptoms of this condition include:  Wheezing. If the episode was triggered by an allergy, wheezing may start right away or hours later.  Nighttime coughing.  Frequent or severe coughing with a simple cold.  Chest tightness.  Shortness of breath.  Decreased ability to exercise.  How is this diagnosed? This condition is usually diagnosed with a review of your medical history and a physical exam. Tests, such as lung function tests, are sometimes done to look for other conditions. The need for a chest X-ray depends on where the wheezing occurs and whether it is the first time you have wheezed. How is this treated? This condition may be treated with:  Inhaled medicines. These open up the airways and help you breathe. They can be taken with an inhaler or a nebulizer device.  Corticosteroid medicines. These may be given for severe bronchospasm, usually when it is associated with asthma.  Avoiding triggers, such as irritants, infection, or allergies.  Follow these instructions at home: Medicines  Take over-the-counter and prescription medicines only as told by your health care provider.  If you need to use an inhaler or nebulizer to take your medicine, ask your health care provider to  explain how to use it correctly. If you were given a spacer, always use it with your inhaler. Lifestyle  Reduce the number of triggers in your home. To do this: ? Change your heating and air conditioning filter at least once a month. ? Limit your use of fireplaces and wood stoves. ? Do not smoke. Do not allow smoking in your home. ? Avoid using perfumes and fragrances. ? Get  rid of pests, such as roaches and mice, and their droppings. ? Remove any mold from your home. ? Keep your house clean and dust free. Use unscented cleaning products. ? Replace carpet with wood, tile, or vinyl flooring. Carpet can trap dander and dust. ? Use allergy-proof pillows, mattress covers, and box spring covers. ? Wash bed sheets and blankets every week in hot water. Dry them in a dryer. ? Use blankets that are made of polyester or cotton. ? Wash your hands often. ? Do not allow pets in your bedroom.  Avoid breathing in cold air when you exercise. General instructions  Have a plan for seeking medical care. Know when to call your health care provider and local emergency services, and where to get emergency care.  Stay up to date on your immunizations.  When you have an episode of bronchospasm, stay calm. Try to relax and breathe more slowly.  If you have asthma, make sure you have an asthma action plan.  Keep all follow-up visits as told by your health care provider. This is important. Contact a health care provider if:  You have muscle aches.  You have chest pain.  The mucus that you cough up (sputum) changes from clear or white to yellow, green, gray, or bloody.  You have a fever.  Your sputum gets thicker. Get help right away if:  Your wheezing and coughing get worse, even after you take your prescribed medicines.  It gets even harder to breathe.  You develop severe chest pain. Summary  Bronchospasm is a tightening of the airways going into the lungs.  During an episode of bronchospasm, you may have a harder time breathing. You may cough and make a whistling sound when you breathe (wheeze).  Avoid exposure to triggers such as smoke, dust, mold, animal dander, and fragrances.  When you have an episode of bronchospasm, stay calm. Try to relax and breathe more slowly. This information is not intended to replace advice given to you by your health care provider.  Make sure you discuss any questions you have with your health care provider. Document Released: 08/20/2003 Document Revised: 08/13/2016 Document Reviewed: 08/13/2016 Elsevier Interactive Patient Education  2017 Elsevier Inc.   Cough, Adult Coughing is a reflex that clears your throat and your airways. Coughing helps to heal and protect your lungs. It is normal to cough occasionally, but a cough that happens with other symptoms or lasts a long time may be a sign of a condition that needs treatment. A cough may last only 2-3 weeks (acute), or it may last longer than 8 weeks (chronic). What are the causes? Coughing is commonly caused by:  Breathing in substances that irritate your lungs.  A viral or bacterial respiratory infection.  Allergies.  Asthma.  Postnasal drip.  Smoking.  Acid backing up from the stomach into the esophagus (gastroesophageal reflux).  Certain medicines.  Chronic lung problems, including COPD (or rarely, lung cancer).  Other medical conditions such as heart failure.  Follow these instructions at home: Pay attention to any changes in your symptoms. Take  these actions to help with your discomfort:  Take medicines only as told by your health care provider. ? If you were prescribed an antibiotic medicine, take it as told by your health care provider. Do not stop taking the antibiotic even if you start to feel better. ? Talk with your health care provider before you take a cough suppressant medicine.  Drink enough fluid to keep your urine clear or pale yellow.  If the air is dry, use a cold steam vaporizer or humidifier in your bedroom or your home to help loosen secretions.  Avoid anything that causes you to cough at work or at home.  If your cough is worse at night, try sleeping in a semi-upright position.  Avoid cigarette smoke. If you smoke, quit smoking. If you need help quitting, ask your health care provider.  Avoid caffeine.  Avoid  alcohol.  Rest as needed.  Contact a health care provider if:  You have new symptoms.  You cough up pus.  Your cough does not get better after 2-3 weeks, or your cough gets worse.  You cannot control your cough with suppressant medicines and you are losing sleep.  You develop pain that is getting worse or pain that is not controlled with pain medicines.  You have a fever.  You have unexplained weight loss.  You have night sweats. Get help right away if:  You cough up blood.  You have difficulty breathing.  Your heartbeat is very fast. This information is not intended to replace advice given to you by your health care provider. Make sure you discuss any questions you have with your health care provider. Document Released: 02/13/2011 Document Revised: 01/23/2016 Document Reviewed: 10/24/2014 Elsevier Interactive Patient Education  2018 [employer].   IF you received an x-ray today, you will receive an invoice from [facility]. Please contact [facility] at [phone] with questions or concerns regarding your invoice.   IF you received labwork today, you will receive an invoice from [facility]. Please contact [facility] at [phone] with questions or concerns regarding your invoice.   Our billing staff will not be able to assist you with questions regarding bills from these companies.  You will be contacted with the lab results as soon as they are available. The fastest way to get your results is to activate your My Chart account. Instructions are located on the last page of this paperwork. If you have not heard from [place] regarding the results in 2 weeks, please contact this office.       Signed,   Meredith Staggers, MD [facility].  04/04/18 9:48 AM

## 2018-04-12 ENCOUNTER — Ambulatory Visit (HOSPITAL_COMMUNITY)
Admission: RE | Admit: 2018-04-12 | Discharge: 2018-04-12 | Disposition: A | Discharge status: Home / Self Care | Payer: 59 | Source: Ambulatory Visit | Attending: Family Medicine | Admitting: Family Medicine

## 2018-04-12 ENCOUNTER — Encounter (HOSPITAL_COMMUNITY): Payer: Self-pay

## 2018-04-12 DIAGNOSIS — F17200 Nicotine dependence, unspecified, uncomplicated: Secondary | ICD-10-CM | POA: Diagnosis present

## 2018-04-12 DIAGNOSIS — K760 Fatty (change of) liver, not elsewhere classified: Secondary | ICD-10-CM | POA: Insufficient documentation

## 2018-04-12 DIAGNOSIS — R918 Other nonspecific abnormal finding of lung field: Secondary | ICD-10-CM | POA: Insufficient documentation

## 2018-04-12 DIAGNOSIS — R05 Cough: Secondary | ICD-10-CM | POA: Diagnosis present

## 2018-04-12 DIAGNOSIS — R059 Cough, unspecified: Secondary | ICD-10-CM

## 2018-04-12 DIAGNOSIS — R911 Solitary pulmonary nodule: Secondary | ICD-10-CM | POA: Diagnosis not present

## 2018-04-12 MED ORDER — IOHEXOL 300 MG/ML  SOLN
75.0000 mL | Freq: Once | INTRAMUSCULAR | Status: AC | PRN
  Administered 2018-04-12: 75 mL via INTRAVENOUS

## 2018-04-12 MED ORDER — IOPAMIDOL (ISOVUE-300) INJECTION 61%: 75.0000 mL | Freq: Once | INTRAVENOUS | Status: DC | PRN

## 2018-05-02 ENCOUNTER — Other Ambulatory Visit: Payer: Self-pay | Admitting: Family Medicine

## 2018-05-02 DIAGNOSIS — R062 Wheezing: Secondary | ICD-10-CM

## 2018-05-29 ENCOUNTER — Other Ambulatory Visit: Payer: Self-pay | Admitting: Family Medicine

## 2018-05-29 DIAGNOSIS — R062 Wheezing: Secondary | ICD-10-CM

## 2020-01-20 ENCOUNTER — Emergency Department (HOSPITAL_BASED_OUTPATIENT_CLINIC_OR_DEPARTMENT_OTHER)
Admission: EM | Admit: 2020-01-20 | Discharge: 2020-01-20 | Disposition: A | Discharge status: Home / Self Care | Payer: 59 | Attending: Emergency Medicine | Admitting: Emergency Medicine

## 2020-01-20 ENCOUNTER — Encounter (HOSPITAL_BASED_OUTPATIENT_CLINIC_OR_DEPARTMENT_OTHER): Payer: Self-pay | Admitting: Emergency Medicine

## 2020-01-20 ENCOUNTER — Other Ambulatory Visit: Payer: Self-pay

## 2020-01-20 DIAGNOSIS — K029 Dental caries, unspecified: Secondary | ICD-10-CM | POA: Diagnosis not present

## 2020-01-20 DIAGNOSIS — F17210 Nicotine dependence, cigarettes, uncomplicated: Secondary | ICD-10-CM | POA: Insufficient documentation

## 2020-01-20 DIAGNOSIS — K0889 Other specified disorders of teeth and supporting structures: Secondary | ICD-10-CM | POA: Diagnosis present

## 2020-01-20 NOTE — Discharge Instructions (Addendum)
Continue take your antibiotics as directed. Treat your symptoms with over-the-counter medications as needed. Follow-up with your dentist.

## 2020-01-20 NOTE — ED Triage Notes (Signed)
R upper dental pain since yesterday.

## 2020-01-20 NOTE — ED Provider Notes (Signed)
[facility] EMERGENCY DEPARTMENT Provider Note   CSN: [id number] Arrival date & time: 01/20/20  1823     History Chief Complaint  Patient presents with  . Dental Pain    Timothy Levine is a 44 y.o. male presenting to the emergency department with complaint of right upper dental pain that began yesterday.  Patient states last week he had multiple teeth removed on his left lower jaw.  He has been taking penicillin as directed and still has doses remaining.  He noticed the right upper last molar causing him throbbing pain yesterday.  No difficulty breathing or swallowing or fevers.  No swelling.   The history is provided by the patient.       Past Medical History:  Diagnosis Date  . Distal radius fracture, left 02/08/2016    There are no problems to display for this patient.   Past Surgical History:  Procedure Laterality Date  . OPEN REDUCTION INTERNAL FIXATION (ORIF) DISTAL RADIAL FRACTURE Left 02/13/2016   Procedure: OPEN TREATMENT OF LEFT DISTAL RADIUS FRACTURE;  Surgeon: Mack Hook, MD;  Location: [facility];  Service: Orthopedics;  Laterality: Left;  . TONSILLECTOMY AND ADENOIDECTOMY         No family history on file.  Social History   Tobacco Use  . Smoking status: Current Every Day Smoker    Packs/day: 0.00    Years: 14.00    Pack years: 0.00    Types: Cigarettes  . Smokeless tobacco: Never Used  . Tobacco comment: 1/3 pack/day  Substance Use Topics  . Alcohol use: Yes    Comment: occasionally  . Drug use: No    Home Medications Prior to Admission medications   Medication Sig Start Date End Date Taking? Authorizing Provider  azithromycin (ZITHROMAX) 250 MG tablet Take 2 pills by mouth on day 1, then 1 pill by mouth per day on days 2 through 5. 04/04/18   Shade Flood, MD  benzonatate (TESSALON) 100 MG capsule Take 1 capsule (100 mg total) by mouth every 8 (eight) hours as needed for cough. 03/30/18   Wurst, [place], PA-C    VENTOLIN HFA 108 (90 Base) MCG/ACT inhaler INHALE 1-2 PUFFS INTO THE LUNGS EVERY 4 (FOUR) HOURS AS NEEDED FOR WHEEZING OR SHORTNESS OF BREATH. 05/03/18   Shade Flood, MD    Allergies    Patient has no known allergies.  Review of Systems   Review of Systems  All other systems reviewed and are negative.   Physical Exam Updated Vital Signs BP (!) 148/107 (BP Location: Left Arm)   Pulse 73   Temp 97.9 F (36.6 C) (Oral)   Resp 16   Ht 6\' 1"  (1.854 m)   Wt 80.3 kg   SpO2 99%   BMI 23.35 kg/m   Physical Exam Vitals and nursing note reviewed.  Constitutional:      General: He is not in acute distress.    Appearance: He is well-developed.  HENT:     Head: Normocephalic and atraumatic.     Mouth/Throat:     Mouth: Mucous membranes are moist.     Comments: Very poor dentition throughout mouth.  Multiple teeth with severe decay.  Right upper last molar with severe decay down to the gingiva.  Surrounding gingiva is not erythematous or tender.  There is no fluctuant abscess.  Uvula is midline, no trismus.  Tolerating secretions. Eyes:     Conjunctiva/sclera: Conjunctivae normal.  Cardiovascular:     Rate and  Rhythm: Normal rate.  Pulmonary:     Effort: Pulmonary effort is normal.     Breath sounds: No stridor.  Neurological:     Mental Status: He is alert.  Psychiatric:        Mood and Affect: Mood normal.        Behavior: Behavior normal.     ED Results / Procedures / Treatments   Labs (all labs ordered are listed, but only abnormal results are displayed) Labs Reviewed - No data to display  EKG None  Radiology No results found.  Procedures Procedures (including critical care time)  Medications Ordered in ED Medications - No data to display  ED Course  I have reviewed the triage vital signs and the nursing notes.  Pertinent labs & imaging results that were available during my care of the patient were reviewed by me and considered in my medical decision  making (see chart for details).    MDM Rules/Calculators/A&P                      Patient with right upper dental pain due to decay.  He is currently on antibiotics for recent dental procedure last week.  No signs of PTA on exam.  He is afebrile and in no distress.  Recommend he continue his course of antibiotics as directed and follow-up with his dentist.  Continue OTC medications as needed for pain.  Return precautions discussed.  Safe for discharge.  Discussed results, findings, treatment and follow up. Patient advised of return precautions. Patient verbalized understanding and agreed with plan.  Final Clinical Impression(s) / ED Diagnoses Final diagnoses:  Pain due to dental caries    Rx / DC Orders ED Discharge Orders    None       Misheel Gowans, [place] N, PA-C 01/20/20 1956    Isla Pence, MD 01/20/20 2011

## 2020-01-20 NOTE — ED Notes (Signed)
EDP at bedside  

## 2020-03-11 ENCOUNTER — Encounter: Payer: Self-pay | Admitting: Family Medicine

## 2020-03-11 ENCOUNTER — Telehealth (INDEPENDENT_AMBULATORY_CARE_PROVIDER_SITE_OTHER): Payer: 59 | Admitting: Family Medicine

## 2020-03-11 ENCOUNTER — Other Ambulatory Visit: Payer: Self-pay

## 2020-03-11 VITALS — Ht 73.0 in | Wt 175.0 lb

## 2020-03-11 DIAGNOSIS — R05 Cough: Secondary | ICD-10-CM

## 2020-03-11 DIAGNOSIS — R0981 Nasal congestion: Secondary | ICD-10-CM | POA: Diagnosis not present

## 2020-03-11 DIAGNOSIS — R059 Cough, unspecified: Secondary | ICD-10-CM

## 2020-03-11 DIAGNOSIS — Z20818 Contact with and (suspected) exposure to other bacterial communicable diseases: Secondary | ICD-10-CM

## 2020-03-11 DIAGNOSIS — J020 Streptococcal pharyngitis: Secondary | ICD-10-CM

## 2020-03-11 MED ORDER — AMOXICILLIN 500 MG PO CAPS: 500.0000 mg | ORAL_CAPSULE | Freq: Three times a day (TID) | ORAL | 0 refills | Status: AC

## 2020-03-11 NOTE — Patient Instructions (Addendum)
Saline nasal spray 3-4 times per day, drink plenty of fluids.  Tylenol if needed for headache or bodyache. mucinex for cough.  Amoxicillin for possible strep exposure, but covid testing also recommended. Return to the clinic or go to the nearest emergency room if any of your symptoms worsen or new symptoms occur.    Cough, Adult Coughing is a reflex that clears your throat and your airways (respiratory system). Coughing helps to heal and protect your lungs. It is normal to cough occasionally, but a cough that happens with other symptoms or lasts a long time may be a sign of a condition that needs treatment. An acute cough may only last 2-3 weeks, while a chronic cough may last 8 or more weeks. Coughing is commonly caused by:  Infection of the respiratory systemby viruses or bacteria.  Breathing in substances that irritate your lungs.  Allergies.  Asthma.  Mucus that runs down the back of your throat (postnasal drip).  Smoking.  Acid backing up from the stomach into the esophagus (gastroesophageal reflux).  Certain medicines.  Chronic lung problems.  Other medical conditions such as heart failure or a blood clot in the lung (pulmonary embolism). Follow these instructions at home: Medicines  Take over-the-counter and prescription medicines only as told by your health care provider.  Talk with your health care provider before you take a cough suppressant medicine. Lifestyle   Avoid cigarette smoke. Do not use any products that contain nicotine or tobacco, such as cigarettes, e-cigarettes, and chewing tobacco. If you need help quitting, ask your health care provider.  Drink enough fluid to keep your urine pale yellow.  Avoid caffeine.  Do not drink alcohol if your health care provider tells you not to drink. General instructions   Pay close attention to changes in your cough. Tell your health care provider about them.  Always cover your mouth when you cough.  Avoid  things that make you cough, such as perfume, candles, cleaning products, or campfire or tobacco smoke.  If the air is dry, use a cool mist vaporizer or humidifier in your bedroom or your home to help loosen secretions.  If your cough is worse at night, try to sleep in a semi-upright position.  Rest as needed.  Keep all follow-up visits as told by your health care provider. This is important. Contact a health care provider if you:  Have new symptoms.  Cough up pus.  Have a cough that does not get better after 2-3 weeks or gets worse.  Cannot control your cough with cough suppressant medicines and you are losing sleep.  Have pain that gets worse or pain that is not helped with medicine.  Have a fever.  Have unexplained weight loss.  Have night sweats. Get help right away if:  You cough up blood.  You have difficulty breathing.  Your heartbeat is very fast. These symptoms may represent a serious problem that is an emergency. Do not wait to see if the symptoms will go away. Get medical help right away. Call your local emergency services (911 in the [place] Do not drive yourself to the hospital. Summary  Coughing is a reflex that clears your throat and your airways. It is normal to cough occasionally, but a cough that happens with other symptoms or lasts a long time may be a sign of a condition that needs treatment.  Take over-the-counter and prescription medicines only as told by your health care provider.  Always cover your mouth when you cough.  Contact a health care provider if you have new symptoms or a cough that does not get better after 2-3 weeks or gets worse. This information is not intended to replace advice given to you by your health care provider. Make sure you discuss any questions you have with your health care provider. Document Revised: 09/05/2018 Document Reviewed: 09/05/2018 Elsevier Patient Education  [employer].    If you have lab work done  today you will be contacted with your lab results within the next 2 weeks.  If you have not heard from [place] then please contact us. The fastest way to get your results is to register for My Chart.   IF you received an x-ray today, you will receive an invoice from [facility]. Please contact [facility] at [phone] with questions or concerns regarding your invoice.   IF you received labwork today, you will receive an invoice from [facility]. Please contact [facility] at [phone] with questions or concerns regarding your invoice.   Our billing staff will not be able to assist you with questions regarding bills from these companies.  You will be contacted with the lab results as soon as they are available. The fastest way to get your results is to activate your My Chart account. Instructions are located on the last page of this paperwork. If you have not heard from [place] regarding the results in 2 weeks, please contact this office.

## 2020-03-11 NOTE — Progress Notes (Signed)
Virtual Visit via Video Note  I connected with Timothy Levine on 03/11/20 at 6:26 PM by a video enabled telemedicine application and verified that I am speaking with the correct person using two identifiers.  Patient location:home My location: office   I discussed the limitations, risks, security and privacy concerns of performing an evaluation and management service by telephone and the availability of in person appointments. I also discussed with the patient that there may be a patient responsible charge related to this service. The patient expressed understanding and agreed to proceed, consent obtained  Chief complaint: Chief Complaint  Patient presents with  . Cough    daughter had strep throat now he doesnt well. (worse yesterday going on for the last few days)  . Nasal Congestion     History of Present Illness: Timothy Levine is a 44 y.o. male  started 3 days ago. Cough, runny nose, nasal congestion. Worse today. Sore throat with cough.  No fever.  No change in taste/smell. No shortness of breath.  works at [employer]. Out of work today.  Drinking/eating ok, swallowing ok.  Occasional allergies in past, not recently.  No headache.  Daughter diagnosed with strep throat last week, 4 yo.  Girlfriend has been congested recently.  No known coworkers that have been sick.  No known exposure to Covid 19, has not been vaccinated.  Not planning on getting vaccine.   Tx: none   There are no problems to display for this patient.  Past Medical History:  Diagnosis Date  . Distal radius fracture, left 02/08/2016   Past Surgical History:  Procedure Laterality Date  . OPEN REDUCTION INTERNAL FIXATION (ORIF) DISTAL RADIAL FRACTURE Left 02/13/2016   Procedure: OPEN TREATMENT OF LEFT DISTAL RADIUS FRACTURE;  Surgeon: Mack Hook, MD;  Location: [facility];  Service: Orthopedics;  Laterality: Left;  . TONSILLECTOMY AND ADENOIDECTOMY     No Known Allergies Prior to  Admission medications   Not on File   Social History   Socioeconomic History  . Marital status: Single    Spouse name: Not on file  . Number of children: Not on file  . Years of education: Not on file  . Highest education level: Not on file  Occupational History  . Not on file  Tobacco Use  . Smoking status: Current Every Day Smoker    Packs/day: 0.00    Years: 14.00    Pack years: 0.00    Types: Cigarettes  . Smokeless tobacco: Never Used  . Tobacco comment: 1/3 pack/day  Substance and Sexual Activity  . Alcohol use: Yes    Comment: occasionally  . Drug use: No  . Sexual activity: Not on file  Other Topics Concern  . Not on file  Social History Narrative  . Not on file   Social Determinants of Health   Financial Resource Strain:   . Difficulty of Paying Living Expenses:   Food Insecurity:   . Worried About Programme researcher, broadcasting/film/video in the Last Year:   . Barista in the Last Year:   Transportation Needs:   . Freight forwarder (Medical):   Marland Kitchen Lack of Transportation (Non-Medical):   Physical Activity:   . Days of Exercise per Week:   . Minutes of Exercise per Session:   Stress:   . Feeling of Stress :   Social Connections:   . Frequency of Communication with Friends and Family:   . Frequency of Social Gatherings with  Friends and Family:   . Attends Religious Services:   . Active Member of Clubs or Organizations:   . Attends Banker Meetings:   Marland Kitchen Marital Status:   Intimate Partner Violence:   . Fear of Current or Ex-Partner:   . Emotionally Abused:   Marland Kitchen Physically Abused:   . Sexually Abused:     Observations/Objective: Vitals:   03/11/20 1617  Weight: 175 lb (79.4 kg)  Height: 6\' 1"  (1.854 m)   Nontoxic appearance, appropriate responses, no respiratory distress  Assessment and Plan: Cough - Plan: amoxicillin (AMOXIL) 500 MG capsule  Nasal congestion - Plan: amoxicillin (AMOXIL) 500 MG capsule  Streptococcal sore throat - Plan:  amoxicillin (AMOXIL) 500 MG capsule  Exposure to strep throat - Plan: amoxicillin (AMOXIL) 500 MG capsule   -Primarily cough, congestion with some sore throat, positive strep throat contacts at home.  Will cover with amoxicillin, but atypical presentations for strep.  Is not vaccinated against COVID-19, recommended testing with isolation precautions, note provided for work.  Symptomatic care with Mucinex, fluids, rest, saline nasal spray, RTC/ER precautions  Follow Up Instructions:    I discussed the assessment and treatment plan with the patient. The patient was provided an opportunity to ask questions and all were answered. The patient agreed with the plan and demonstrated an understanding of the instructions.   The patient was advised to call back or seek an in-person evaluation if the symptoms worsen or if the condition fails to improve as anticipated.  I provided 23 minutes of non-face-to-face time during this encounter.   , MD
